# Patient Record
Sex: Male | Born: 1975 | Race: White | Hispanic: No | Marital: Single | Smoking: Current every day smoker
Health system: Southern US, Community
[De-identification: ages and names within clinical notes are randomized; demographics above are authoritative.]

## PROBLEM LIST (undated history)

## (undated) DIAGNOSIS — M1990 Unspecified osteoarthritis, unspecified site: Secondary | ICD-10-CM

## (undated) DIAGNOSIS — J449 Chronic obstructive pulmonary disease, unspecified: Secondary | ICD-10-CM

## (undated) HISTORY — PX: ELBOW SURGERY: SHX618

## (undated) HISTORY — PX: WRIST SURGERY: SHX841

## (undated) HISTORY — PX: HAND SURGERY: SHX662

---

## 2002-02-08 ENCOUNTER — Emergency Department (HOSPITAL_COMMUNITY): Admission: EM | Admit: 2002-02-08 | Discharge: 2002-02-08 | Payer: Self-pay | Admitting: Emergency Medicine

## 2005-02-28 ENCOUNTER — Emergency Department: Payer: Self-pay | Admitting: Emergency Medicine

## 2006-01-01 ENCOUNTER — Emergency Department: Payer: Self-pay | Admitting: Internal Medicine

## 2006-02-15 ENCOUNTER — Emergency Department: Payer: Self-pay | Admitting: Emergency Medicine

## 2007-11-13 ENCOUNTER — Emergency Department: Payer: Self-pay | Admitting: Emergency Medicine

## 2010-08-17 ENCOUNTER — Observation Stay: Payer: Self-pay | Admitting: Internal Medicine

## 2010-08-22 LAB — PATHOLOGY REPORT

## 2010-10-15 ENCOUNTER — Emergency Department: Payer: Self-pay | Admitting: Emergency Medicine

## 2011-07-05 ENCOUNTER — Emergency Department: Payer: Self-pay | Admitting: Unknown Physician Specialty

## 2011-07-11 ENCOUNTER — Ambulatory Visit: Payer: Self-pay | Admitting: Internal Medicine

## 2011-12-05 ENCOUNTER — Emergency Department: Payer: Self-pay | Admitting: Emergency Medicine

## 2011-12-14 LAB — ETHANOL
Ethanol %: 0.006 % (ref 0.000–0.080)
Ethanol: 6 mg/dL

## 2011-12-14 LAB — TSH: Thyroid Stimulating Horm: 3.72 u[IU]/mL

## 2011-12-14 LAB — COMPREHENSIVE METABOLIC PANEL
Albumin: 4.4 g/dL (ref 3.4–5.0)
Alkaline Phosphatase: 109 U/L (ref 50–136)
Bilirubin,Total: 0.6 mg/dL (ref 0.2–1.0)
Chloride: 106 mmol/L (ref 98–107)
Co2: 27 mmol/L (ref 21–32)
Creatinine: 0.87 mg/dL (ref 0.60–1.30)
Glucose: 99 mg/dL (ref 65–99)
Osmolality: 283 (ref 275–301)
Sodium: 141 mmol/L (ref 136–145)

## 2011-12-14 LAB — URINALYSIS, COMPLETE
Bacteria: NONE SEEN
Blood: NEGATIVE
Ph: 5 (ref 4.5–8.0)
RBC,UR: <1 /HPF (ref 0–5)
Squamous Epithelial: NONE SEEN

## 2011-12-14 LAB — CBC
MCH: 34.9 pg — ABNORMAL HIGH (ref 26.0–34.0)
MCHC: 34.7 g/dL (ref 32.0–36.0)
MCV: 100 fL (ref 80–100)
Platelet: 204 10*3/uL (ref 150–440)
RDW: 13.4 % (ref 11.5–14.5)

## 2011-12-15 ENCOUNTER — Inpatient Hospital Stay: Payer: Self-pay | Admitting: Psychiatry

## 2011-12-15 LAB — DRUG SCREEN, URINE
Amphetamines, Ur Screen: NEGATIVE (ref ?–1000)
Barbiturates, Ur Screen: NEGATIVE (ref ?–200)
Cannabinoid 50 Ng, Ur ~~LOC~~: NEGATIVE (ref ?–50)
Cocaine Metabolite,Ur ~~LOC~~: NEGATIVE (ref ?–300)
Methadone, Ur Screen: NEGATIVE (ref ?–300)
Opiate, Ur Screen: POSITIVE (ref ?–300)
Phencyclidine (PCP) Ur S: NEGATIVE (ref ?–25)

## 2011-12-16 LAB — FOLATE: Folic Acid: 24.8 ng/mL (ref 3.1–100.0)

## 2011-12-19 LAB — DRUG SCREEN, URINE
Cannabinoid 50 Ng, Ur ~~LOC~~: NEGATIVE (ref ?–50)
Cocaine Metabolite,Ur ~~LOC~~: NEGATIVE (ref ?–300)
MDMA (Ecstasy)Ur Screen: NEGATIVE (ref ?–500)
Phencyclidine (PCP) Ur S: NEGATIVE (ref ?–25)

## 2012-03-18 ENCOUNTER — Emergency Department: Payer: Self-pay | Admitting: Emergency Medicine

## 2012-03-18 LAB — COMPREHENSIVE METABOLIC PANEL
Albumin: 4.5 g/dL (ref 3.4–5.0)
Alkaline Phosphatase: 114 U/L (ref 50–136)
BUN: 8 mg/dL (ref 7–18)
Bilirubin,Total: 0.5 mg/dL (ref 0.2–1.0)
Calcium, Total: 8.5 mg/dL (ref 8.5–10.1)
Chloride: 111 mmol/L — ABNORMAL HIGH (ref 98–107)
Creatinine: 0.72 mg/dL (ref 0.60–1.30)
EGFR (African American): >60
EGFR (Non-African Amer.): >60
Glucose: 75 mg/dL (ref 65–99)
SGOT(AST): 29 U/L (ref 15–37)
SGPT (ALT): 22 U/L (ref 12–78)
Total Protein: 8.1 g/dL (ref 6.4–8.2)

## 2012-03-18 LAB — DRUG SCREEN, URINE
Benzodiazepine, Ur Scrn: NEGATIVE (ref ?–200)
Cannabinoid 50 Ng, Ur ~~LOC~~: NEGATIVE (ref ?–50)
MDMA (Ecstasy)Ur Screen: NEGATIVE (ref ?–500)
Opiate, Ur Screen: NEGATIVE (ref ?–300)

## 2012-03-18 LAB — CBC
HCT: 46.5 % (ref 40.0–52.0)
HGB: 16.1 g/dL (ref 13.0–18.0)
MCH: 34.2 pg — ABNORMAL HIGH (ref 26.0–34.0)
MCHC: 34.6 g/dL (ref 32.0–36.0)
MCV: 99 fL (ref 80–100)
RDW: 14 % (ref 11.5–14.5)
WBC: 8.4 10*3/uL (ref 3.8–10.6)

## 2012-03-18 LAB — SALICYLATE LEVEL: Salicylates, Serum: 1.9 mg/dL

## 2012-03-18 LAB — ETHANOL: Ethanol %: 0.278 % — ABNORMAL HIGH (ref 0.000–0.080)

## 2012-03-18 LAB — ACETAMINOPHEN LEVEL: Acetaminophen: <2 ug/mL

## 2012-07-24 ENCOUNTER — Emergency Department: Payer: Self-pay | Admitting: Emergency Medicine

## 2013-02-05 ENCOUNTER — Emergency Department: Payer: Self-pay | Admitting: Emergency Medicine

## 2013-02-15 ENCOUNTER — Emergency Department: Payer: Self-pay | Admitting: Emergency Medicine

## 2013-03-04 ENCOUNTER — Ambulatory Visit: Payer: Self-pay | Admitting: Nurse Practitioner

## 2013-05-30 IMAGING — CR DG SHOULDER 3+V*R*
1 series · 3 of 3 positions shown · non-contrast
Comparison: none

REASON FOR EXAM: pain x 3 days s./p fall     Flex 10
COMMENTS:   LMP: (Male)

PROCEDURE:     DXR - DXR SHOULDER RIGHT COMPLETE  - July 05, 2011  [DATE]
RESULT:     Comparison: None.

[Series 1: w shoulder external right · 0.14mm/px · 3 of 3 slices shown]
[im 1/3]
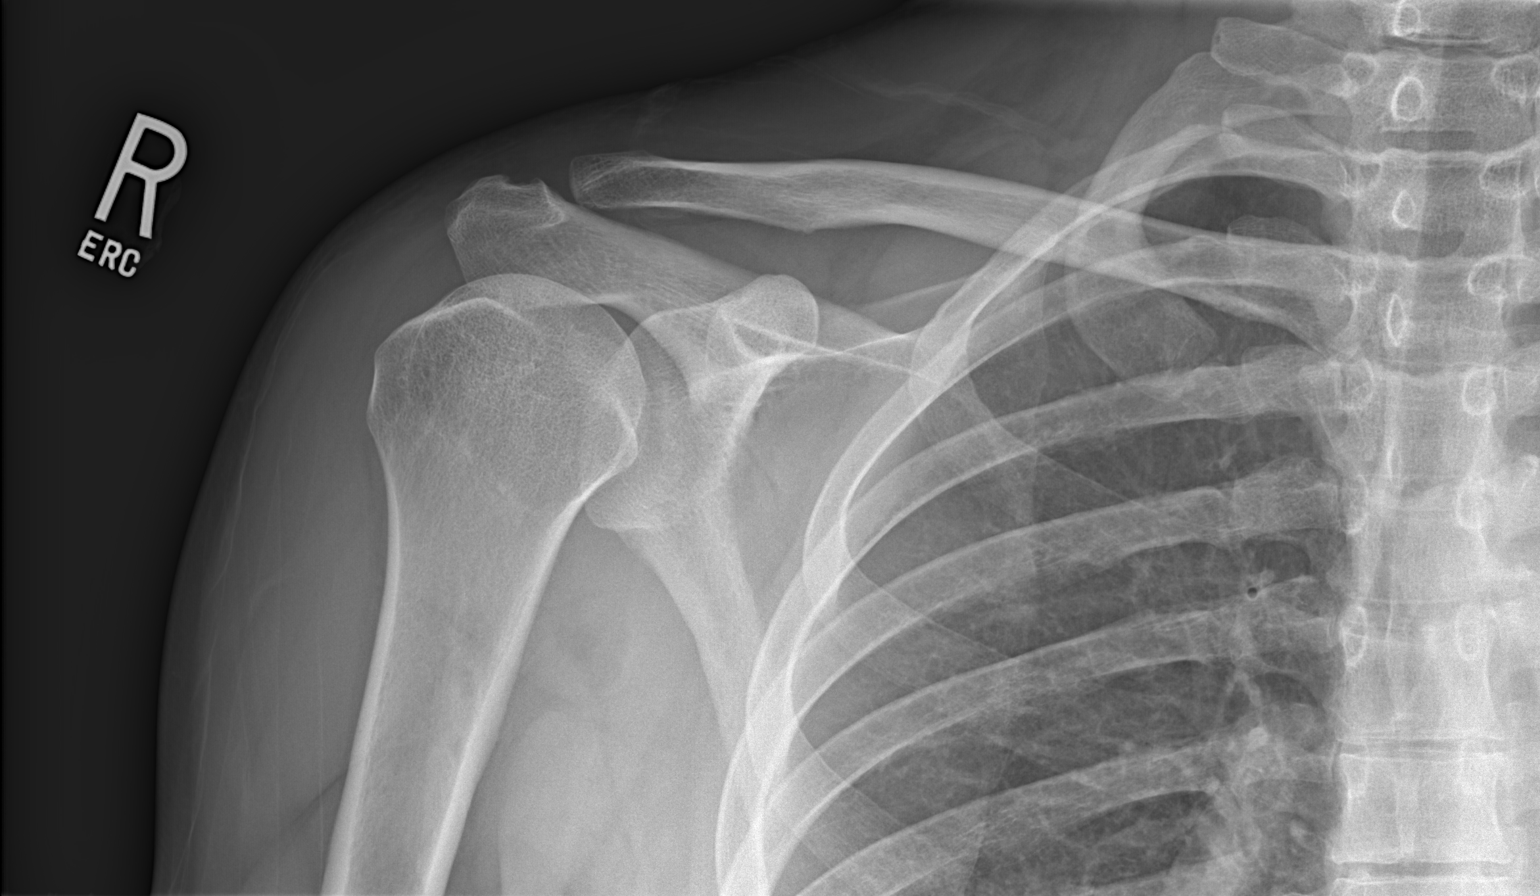
[im 2/3]
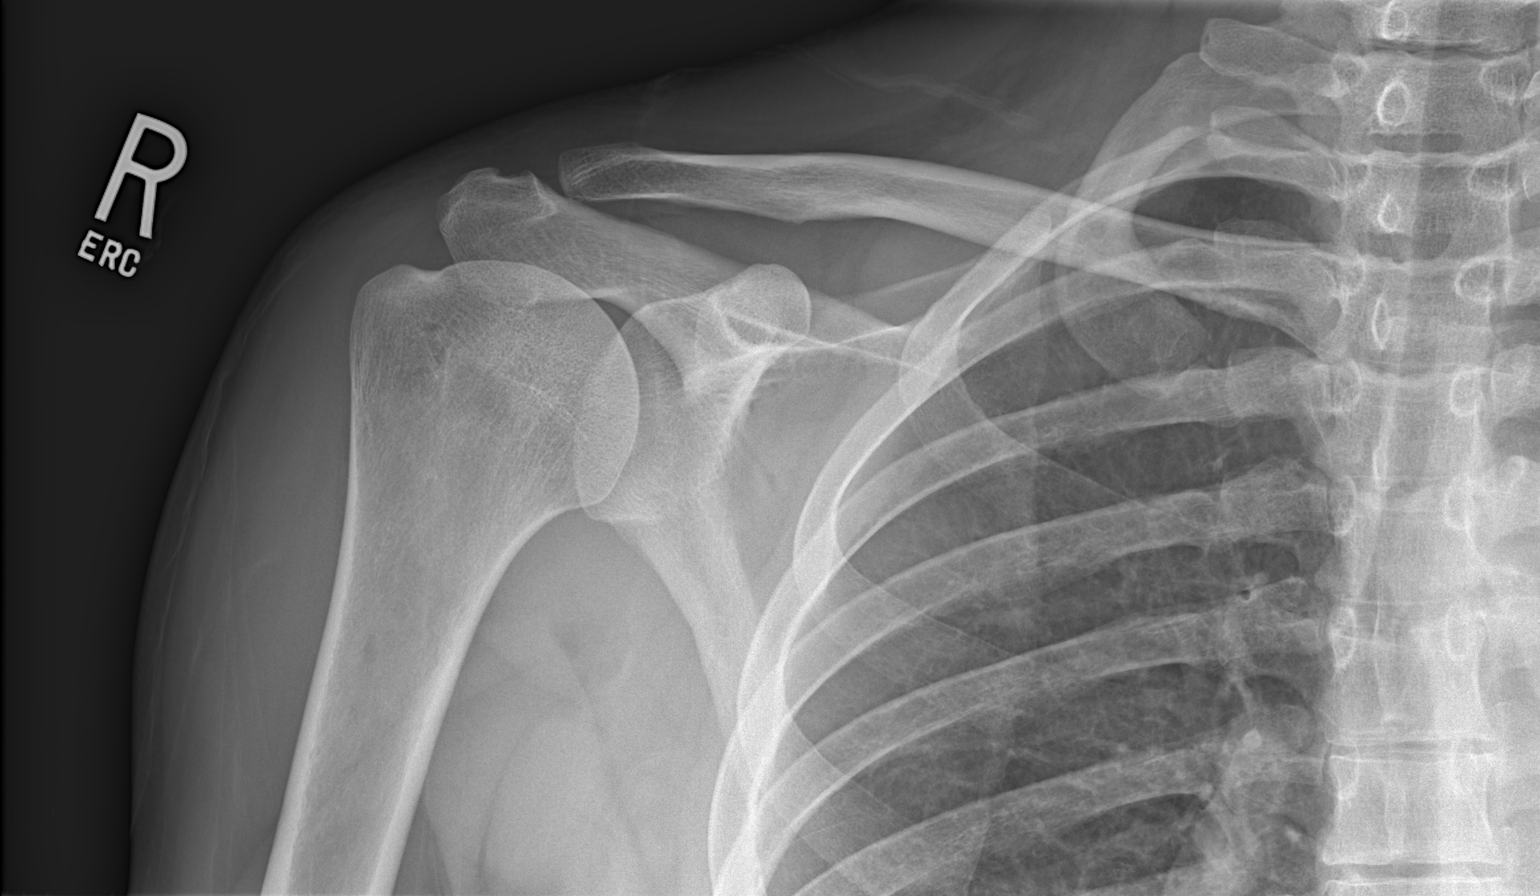
[im 3/3]
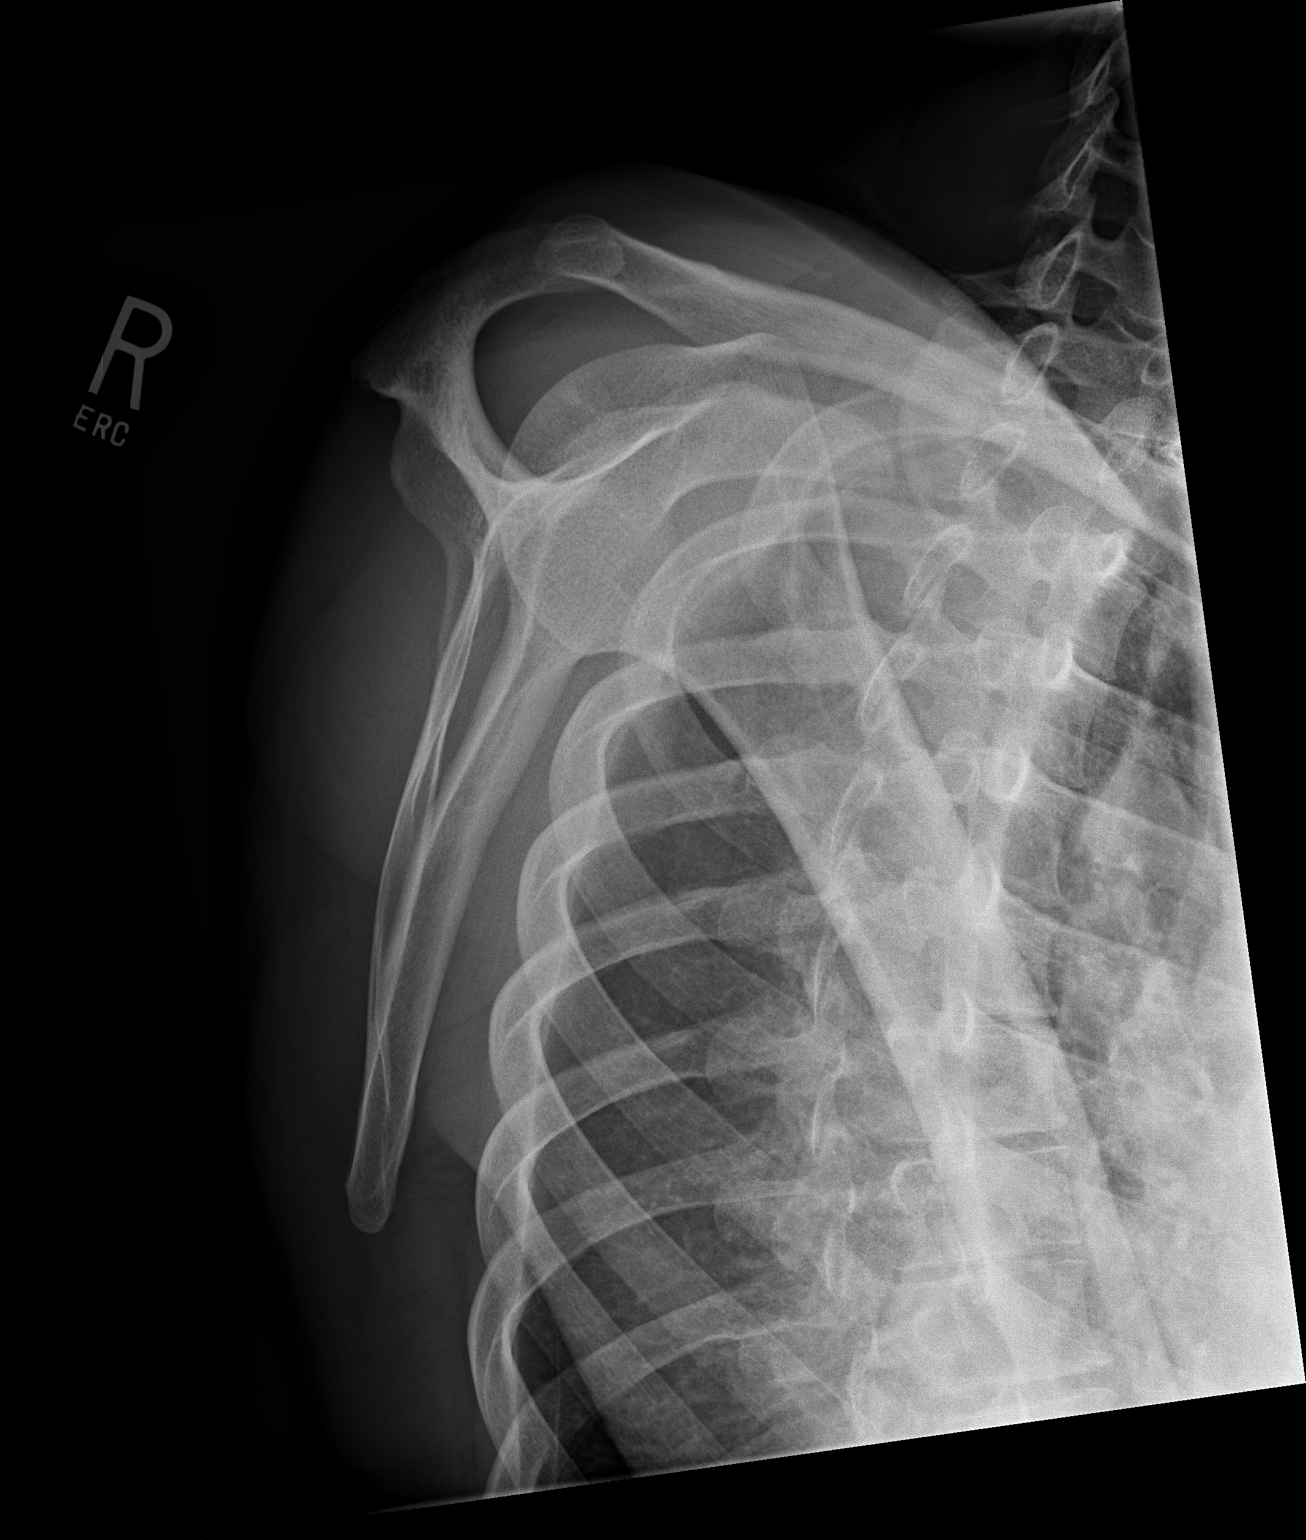

[3 of 3 positions shown; findings below may reference images not displayed]

FINDINGS: Evaluation of the orthogonal view is slightly limited by obliquity. No
definite fracture or dislocation. The acromioclavicular joint is
unremarkable.
IMPRESSION: No definite fracture or dislocation.

## 2013-10-30 IMAGING — CR DG RIBS 2V*R*
1 series · 4 of 4 positions shown · non-contrast
Comparison: none

REASON FOR EXAM: pain, trauma, increased pain with movement.
COMMENTS:   LMP: (Male)

PROCEDURE:     DXR - DXR RIBS RIGHT UNILATERAL  - December 05, 2011  [DATE]
RESULT:     There is no evidence of fracture, dislocation, or malalignment.

[Series 1: w ribs ap upper right · 0.14mm/px · 4 of 4 slices shown]
[im 1/4]
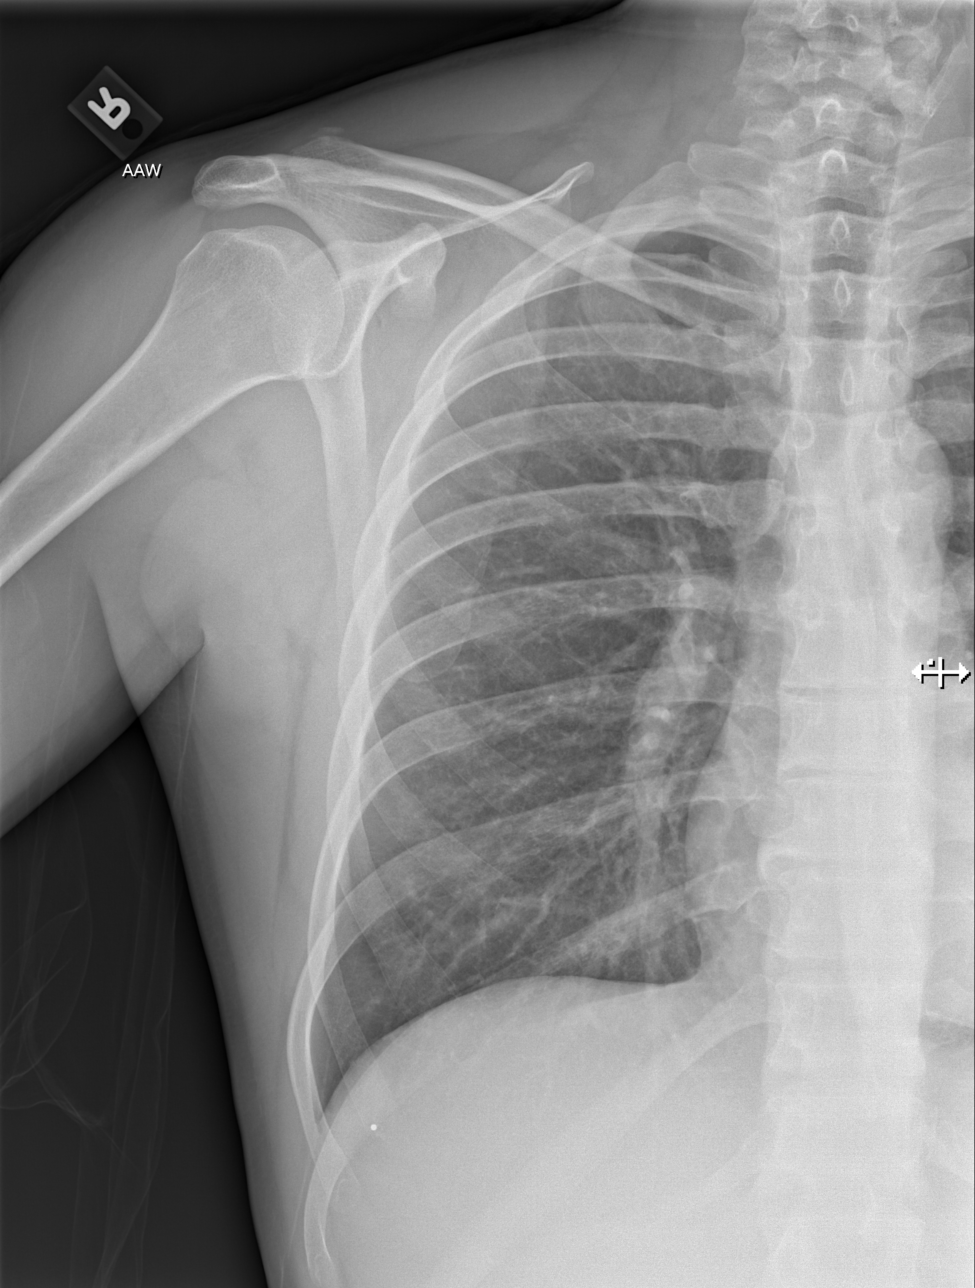
[im 2/4]
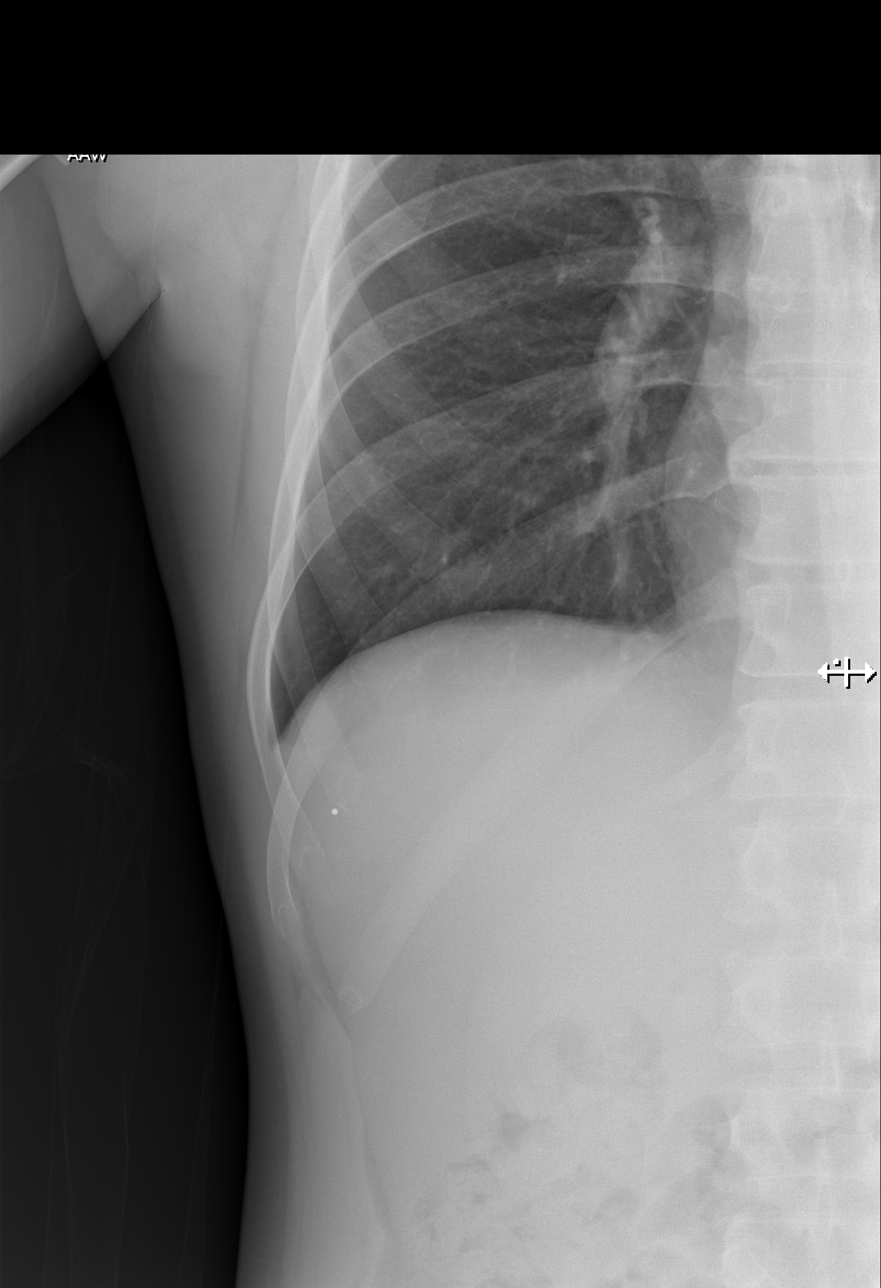
[im 3/4]
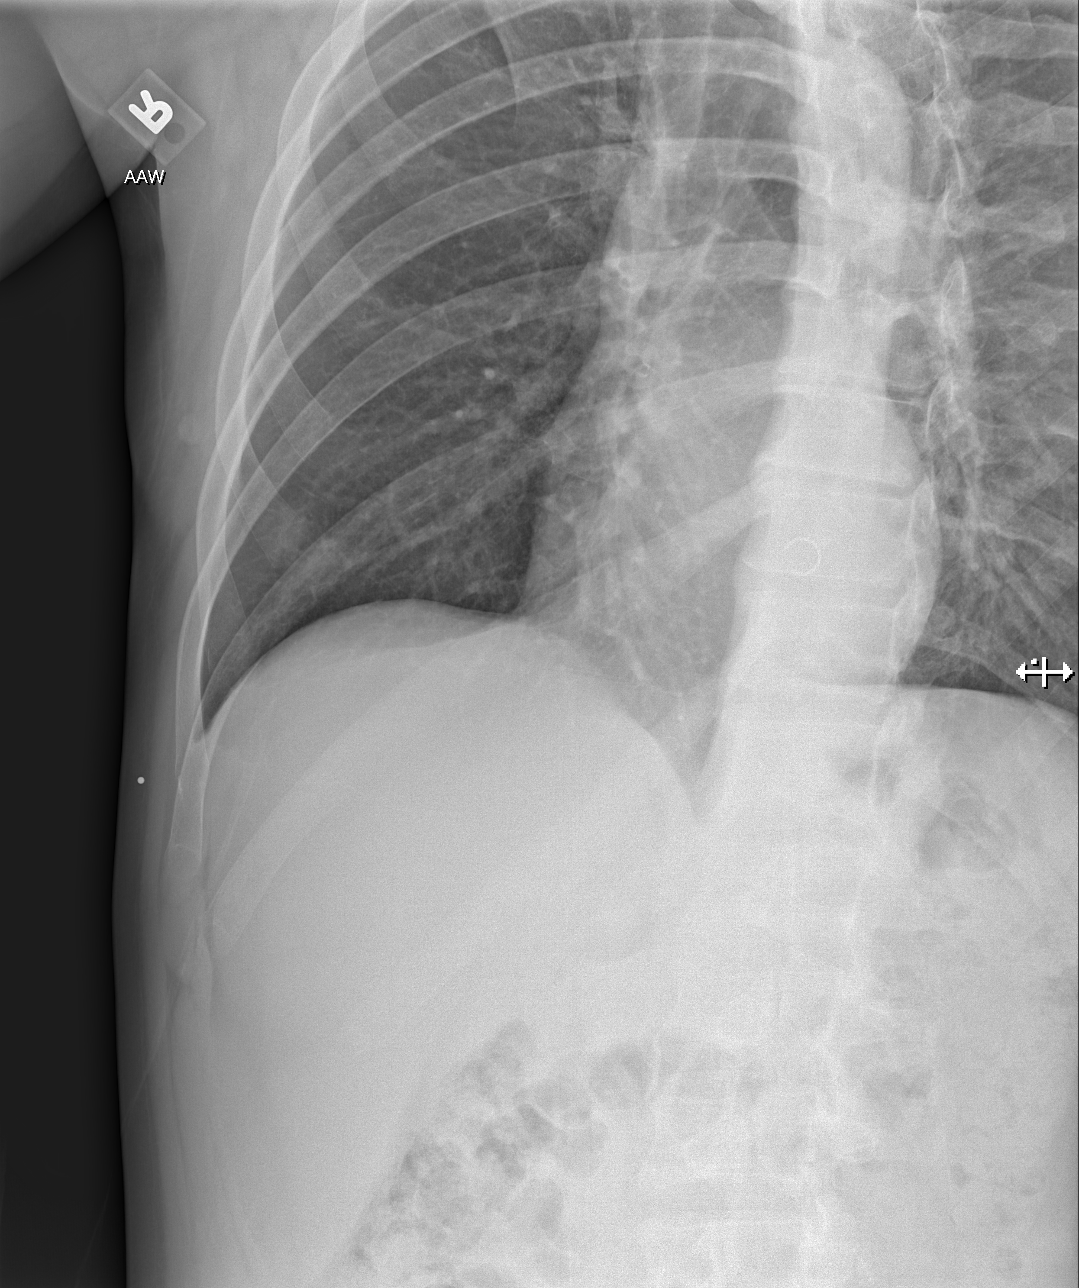
[im 4/4]
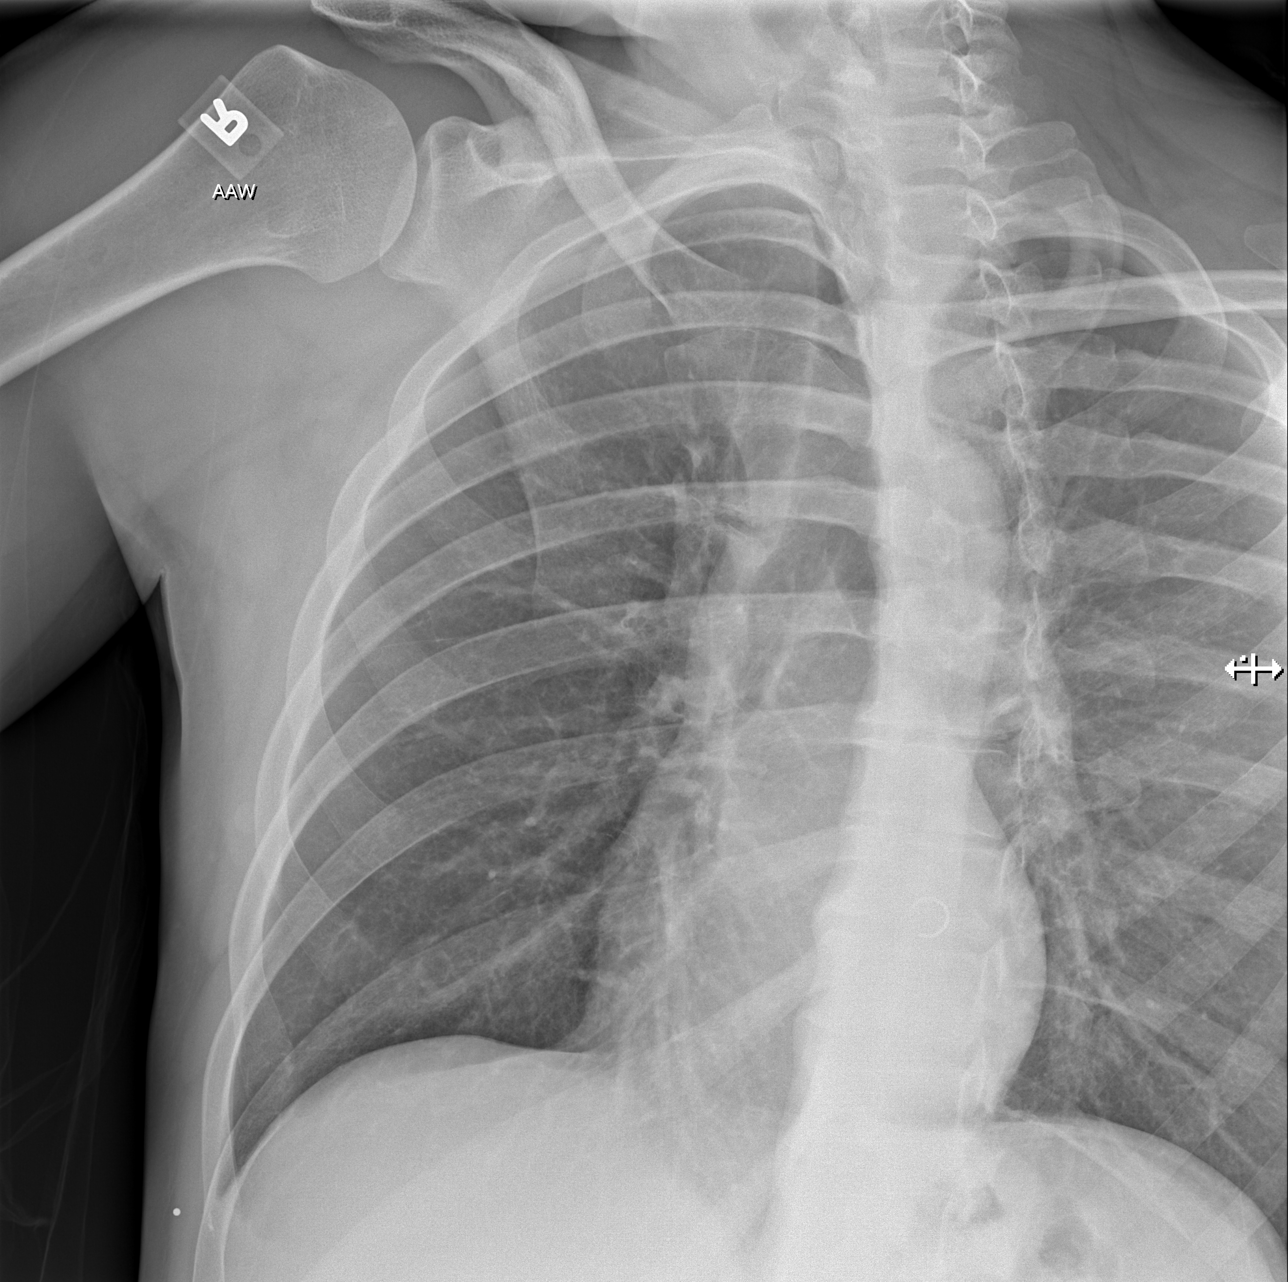

[4 of 4 positions shown; findings below may reference images not displayed]

IMPRESSION: 1. No evidence of acute abnormalities.
2. If there are persistent complaints of pain or persistent clinical
concern, a repeat evaluation in 7-10 days is recommended if clinically
warranted.

## 2014-08-12 NOTE — H&P (Signed)
PATIENT NAME:  Matthew Byrd, Matthew Byrd MR#:  [id number] DATE OF BIRTH:  1976/01/01  DATE OF ADMISSION:  12/15/2011  REFERRING PHYSICIAN: Bayard Males, MD  ADMITTING PHYSICIAN: Caryn Section, MD  REASON FOR ADMISSION: Suicidal thoughts and alcohol dependence.   IDENTIFYING INFORMATION: Matthew Byrd is a 39 year old Caucasian male with a 15 year history of alcohol dependence who was brought to the emergency room under IVC taken out by his sister. He states that he has three biological children and one stepdaughter. He currently works at [employer] for the past six months and lives with his girlfriend in the [place] area.  HISTORY OF PRESENT ILLNESS: Matthew Byrd is a 39 year old divorced Caucasian male with a history of alcohol dependence who was brought to the Emergency Room under IVC taken out by his sister due to the patient endorsing suicidal thoughts yesterday. The patient apparently asked his friend for a gun and had been endorsing thoughts that he wanted to be with his deceased mother. The patient apparently went and tried to lie in the street and said that he was going to be with his mother soon. According to his sister, his mother died one year ago. The patient himself was having a hard time recalling when his mother died. He has been drinking three to four 24 ounce beers every evening and says he binges up to an 18 pack on the weekends. He denies having blackouts but collateral information from his sister indicates episodes in which the patient is having problems with loss of memory that may be indicative of blackouts. He does deny any shakes or tremors. He has a history of three DUIs and is currently on probation for his most recent DUI. He denies any depressive symptoms including crying spells or difficulty with focus and concentration or feelings of hopelessness, but his sister reports that he has been crying on a daily basis and he has been carrying on conversations with his mother.  The patient himself is denying all symptoms stating that he does not have a problem and wanting to be discharged. He is denying any auditory or visual hallucinations. He denies any paranoid thoughts or delusions. The patient's sister reports that he has been having some aggressive episodes during these supposed blackouts in which he will call his stepfather and curse at him, but then cannot remember any of it. The patient denies any history of any prior suicide attempts or inpatient psychiatric hospitalizations. He has not ever been on any medications before for depression and states that he does not feel like he has a problem that needs medication. He denies any illicit drug use and says that he was given a prescription for oxycodone at [facility] yesterday. Toxicology screen was positive for opioids but negative for all other substances. Again, the history taken from the patient's sister and the patient differ. The patient's affect was quite blunted during the interview and speech was minimal. He was guarded and did not want to answer questions.   PAST PSYCHIATRIC HISTORY: The patient denies any prior inpatient psychiatric hospitalizations or suicide attempts. He did have court-ordered treatment at [facility] in the past. He denies ever being on any psychotropic medications in the past.   SUBSTANCE ABUSE HISTORY: As stated in the history of present illness, there is a 15 history of alcohol dependence with binge drinking on the weekends and drinking 3 to 4 beers on a daily basis through the week. He denies any shakes or tremors when he stops  drinking. He denies any history of any alcohol withdrawal seizures. He denies any history of any cocaine, cannabis, opiate, or prescription narcotic abuse. He does smoke 1 pack of cigarettes per day and has been smoking since his late teens.   FAMILY PSYCHIATRIC HISTORY: Both of the patient's parents were alcoholics and he has a paternal uncle that was an alcoholic.    PAST MEDICAL HISTORY: His primary care physician is Dr. Bluford Main at [facility]. He has asthma. He reports two broken ribs that occurred 10 days ago, although chest x-ray in the Emergency Room was negative for any fractures, on 12/02/2011, at [facility]. He has history of peptic ulcer disease and GI bleed, history of left elbow surgery, history of right wrist surgery, and history of finger surgery on the left hand. He denies any history of any prior TBI or seizures.   OUTPATIENT MEDICATIONS:  1. Oxycodone p.r.n.  2. Ibuprofen p.r.n.   ALLERGIES: Hydrocodone gives him a rash.   SOCIAL HISTORY: The patient was born and raised in [place] by both his biological parents. He says he was close to both parents growing up but they divorced when he was a child and his mother remarried. Both parents are deceased. The patient's mother died one year ago of chronic obstructive pulmonary disease and the patient's father died somewhere in the late 1990s of a myocardial infarction. He has a ninth-grade education and never obtained his GED. He currently lives in the [place] area with his girlfriend and is divorced. He has had two prior marriages. He has four children, an 28 year old who lives with his grandparents, a 73 year old who lives with their mother, a 81 year old daughter that is his girlfriend's child, and a 40-year-old with his girlfriend. The patient says his girlfriend does not drink heavily at all.   LEGAL HISTORY: He does have a history of two DUIs and is currently on probation for his last DUI.   MENTAL STATUS EXAM: Matthew Byrd is a 39 year old Caucasian male who is wearing burgundy scrub pants and a lime green shirt. He was fully alert and oriented to place and situation. He gave the month as August and the year as 2013, but had a difficult time coming up with the day of the month. He knew that it was Thursday. Speech was slow and soft but fluent and coherent. The patient was guarded and  affect was blunted. He did not want to answer a lot of questions and was very upset that he had been committed. Mood was described as being "not good". Thought processes were linear, logical, and goal directed. He denied any current suicidal thoughts or homicidal thoughts. She denied any current auditory or visual hallucinations. He denied any paranoid thoughts or delusions. Attention and concentration were fair, but the patient answered "I don't know" to a lot of questions asked with regards to memory and recall. He named [employer] as the current president, but he had difficulty naming any prior presidents. He was able to spell world backwards correctly as "DLROW". He had a difficult time with serial sevens after 93. When asked about proverbs, the patient said "I don't know".   SUICIDE RISK ASSESSMENT: At this time, Matthew Byrd denies any current suicidal thoughts or homicidal thoughts, but according to his family was asking a friend for a gun and has been talking about being close to his deceased mother. His risk of harm to self and others at this time is moderate.  REVIEW OF SYSTEMS: CONSTITUTIONAL: He denies  any weakness, fatigue or weight changes. He denies any fever, chills, or night sweats. HEAD: He denies headaches or dizziness. EYES: He denies any diplopia or blurred vision. ENT: He denies any neck pain or throat pain. He denies any difficulty swallowing. RESPIRATORY: He denies any shortness of breath or cough. CARDIOVASCULAR: He denies chest pain or orthopnea. GASTROINTESTINAL: He denies any nausea, vomiting, or abdominal pain. He denies any change in bowel movements. GENITOURINARY: He denies incontinence or problems with frequency of urine. ENDOCRINE: He denies any heat or cold intolerance. LYMPHATIC: He denies anemia or easy bruising. MUSCULOSKELETAL: He does complain of right-sided rib pain. He denies any other muscle or joint pain. NEUROLOGIC: He denies any tingling or weakness. PSYCHIATRIC: Please see  history of present illness.     PHYSICAL EXAMINATION:   VITAL SIGNS: Blood pressure is 106/74, heart rate 64, respirations 18, and temperature 96.9.   HEENT: Normocephalic, atraumatic. Pupils are equal, round and reactive to light and accommodation. Extraocular movements are intact. Oral mucosa is moist. No lesions are noted.   NECK: Neck was supple. No cervical lymphadenopathy or thyromegaly present.   LUNGS: Clear to auscultation bilaterally. No crackles, rales, or rhonchi.   CARDIAC: S1 and S2 present, regular rate and rhythm. No murmurs, rubs, or gallops.   ABDOMEN: Soft and normoactive bowel sounds present in all four quadrants. No tenderness noted. No masses noted.   EXTREMITIES: The patient had multiple tattoos on his upper extremities and his chest as well as his neck. No rashes, clubbing, or edema.   NEUROLOGIC: Cranial nerves II through XII grossly intact. Gait was normal and steady. Negative Romberg. No tremors noted.   LABS/STUDIES: Toxicology screen positive for opioids but negative for all other substances. Ethanol level 6. BMP within normal limits. TSH within normal limits. Alkaline phosphatase 109, AST 45, and ALT 43. White blood cell count, hemoglobin, and platelet count all within normal limits. Urinalysis was nitrite and leukocyte esterase negative with 1 WBC and no bacteria.   DIAGNOSES:   AXIS I:  1. Alcohol dependence.  2. Rule out major depression with psychosis.   AXIS II: Deferred.   AXIS III: Asthma and recent injury to the right side of his chest and possible rib fracture.   AXIS IV: Moderate - comorbid substance use, not wanting to be compliant with treatment, struggling with the death of his mother, and history of legal problems.  AXIS V: GAF at present equals 30.   ASSESSMENT AND TREATMENT RECOMMENDATIONS: Matthew Byrd is a 39 year old divorced Caucasian male with a history of alcohol dependence over the past 15 years who was brought to the Emergency  Room after endorsing suicidal thoughts and asking a friend for a gun. He is denying any psychotic symptoms although his sister reports that he has been talking to his deceased mother. We will admit to inpatient psychiatry for medication management, safety, stabilization, and detox.  1. Rule out major depressive disorder: At this time, the patient is denying any symptoms of depression and is angry that he was committed. His sister gives a different story and says that he has been severely depressed since his mother died. We will look at possibly starting Zoloft 50 mg p.o. daily for depressive symptoms in the hope that the patient will be more open about depressive symptoms. We will check B12 and folic acid in the a.m. We will offer trazodone 100 mg p.o. nightly for insomnia.  2. Alcohol dependence: We will place the patient on Ativan per CIWA as  well as give multivitamin, thiamine and folic acid. At this time, he is opposed to any residential substance abuse treatment and does not feel like he has a problem with alcohol. We will help to increase insight with regards to negative consequences of alcohol use and encourage the patient to enter a meaningful recovery program.  3. Asthma: We will offer albuterol inhaler p.r.n.  4. Right-sided rib pain: We will get repeat chest x-ray to see if the patient actually does have fractures on the right side as a followup to the 12/02/2011 chest x-ray.  5. Disposition: The patient has a stable living situation. We will recommend substance abuse treatment at the time of discharge as well as psychotropic medication management.  ____________________________ Doralee AlbinoAarti K. Maryruth BunKapur, MD akk:slb D: 12/15/2011 10:02:59 ET     T: 12/15/2011 10:31:01 ET        JOB#: [id number] cc: Jesiah Yerby K. Maryruth BunKapur, MD, <Dictator> Darliss RidgelAARTI K Kellianne Ek MD ELECTRONICALLY SIGNED 12/20/2011 10:02

## 2014-11-19 ENCOUNTER — Emergency Department: Payer: Medicaid Other

## 2014-11-19 ENCOUNTER — Encounter: Payer: Self-pay | Admitting: Emergency Medicine

## 2014-11-19 ENCOUNTER — Emergency Department
Admission: EM | Admit: 2014-11-19 | Discharge: 2014-11-19 | Disposition: A | Discharge status: Home / Self Care | Payer: Medicaid Other | Attending: Emergency Medicine | Admitting: Emergency Medicine

## 2014-11-19 DIAGNOSIS — W208XXA Other cause of strike by thrown, projected or falling object, initial encounter: Secondary | ICD-10-CM | POA: Insufficient documentation

## 2014-11-19 DIAGNOSIS — Y9289 Other specified places as the place of occurrence of the external cause: Secondary | ICD-10-CM | POA: Insufficient documentation

## 2014-11-19 DIAGNOSIS — Z720 Tobacco use: Secondary | ICD-10-CM | POA: Insufficient documentation

## 2014-11-19 DIAGNOSIS — Y9389 Activity, other specified: Secondary | ICD-10-CM | POA: Diagnosis not present

## 2014-11-19 DIAGNOSIS — S62634B Displaced fracture of distal phalanx of right ring finger, initial encounter for open fracture: Secondary | ICD-10-CM | POA: Insufficient documentation

## 2014-11-19 DIAGNOSIS — Y998 Other external cause status: Secondary | ICD-10-CM | POA: Diagnosis not present

## 2014-11-19 DIAGNOSIS — IMO0001 Reserved for inherently not codable concepts without codable children: Secondary | ICD-10-CM

## 2014-11-19 DIAGNOSIS — S6991XA Unspecified injury of right wrist, hand and finger(s), initial encounter: Secondary | ICD-10-CM | POA: Diagnosis present

## 2014-11-19 MED ORDER — OXYCODONE-ACETAMINOPHEN 7.5-325 MG PO TABS: 1.0000 | ORAL_TABLET | ORAL | Status: DC | PRN

## 2014-11-19 MED ORDER — LIDOCAINE HCL (PF) 1 % IJ SOLN
5.0000 mL | Freq: Once | INTRAMUSCULAR | Status: AC
  Administered 2014-11-19: 5 mL via INTRADERMAL
  Filled 2014-11-19: qty 5

## 2014-11-19 MED ORDER — OXYCODONE-ACETAMINOPHEN 5-325 MG PO TABS
1.0000 | ORAL_TABLET | Freq: Once | ORAL | Status: AC
  Administered 2014-11-19: 1 via ORAL
  Filled 2014-11-19: qty 1

## 2014-11-19 MED ORDER — SILVER NITRATE-POT NITRATE 75-25 % EX MISC
3.0000 | Freq: Once | CUTANEOUS | Status: AC
  Administered 2014-11-19: 3 via TOPICAL

## 2014-11-19 MED ORDER — SULFAMETHOXAZOLE-TRIMETHOPRIM 800-160 MG PO TABS: 1.0000 | ORAL_TABLET | Freq: Two times a day (BID) | ORAL | Status: DC

## 2014-11-19 MED ORDER — TRIPLE ANTIBIOTIC 3.5-400-5000 EX OINT
TOPICAL_OINTMENT | Freq: Once | CUTANEOUS | Status: AC
Start: 2014-11-19 — End: 2014-11-19
  Administered 2014-11-19: 11:00:00 via TOPICAL

## 2014-11-19 MED ORDER — SILVER NITRATE-POT NITRATE 75-25 % EX MISC
CUTANEOUS | Status: AC
  Administered 2014-11-19: 3 via TOPICAL
  Filled 2014-11-19: qty 2

## 2014-11-19 NOTE — ED Notes (Signed)
Ladder fell on finger tip , right hand 4th digit . drsg applied in triage , oozing blood noted from injury

## 2014-11-19 NOTE — ED Provider Notes (Signed)
[facility] Emergency Department Provider Note  ____________________________________________  Time seen: Approximately 10:07 AM  I have reviewed the triage vital signs and the nursing notes.   HISTORY  Chief Complaint Extremity Laceration    HPI ZUBAIR LOFTON III is a 39 y.o. male patient Complaining of nail avulsion and laceration to the distal fourth digit right hand. Patient states is secondary to a ladder falling on the hand causing a crush injury. Mild hemorrhage and is still apparent. Patient denies any loss sensation loss of function. Patient stated he believed the bone is exposed. No palliative measures.He is rating his pain as sharp and a 10 over 10.   History reviewed. No pertinent past medical history.  There are no active problems to display for this patient.   History reviewed. No pertinent past surgical history.  Current Outpatient Rx  Name  Route  Sig  Dispense  Refill  . oxyCODONE-acetaminophen (PERCOCET) 7.5-325 MG per tablet   Oral   Take 1 tablet by mouth every 4 (four) hours as needed for severe pain.   20 tablet   0   . sulfamethoxazole-trimethoprim (BACTRIM DS,SEPTRA DS) 800-160 MG per tablet   Oral   Take 1 tablet by mouth 2 (two) times daily.   20 tablet   0     Allergies Hydrocil and Hydrocodone  No family history on file.  Social History History  Substance Use Topics  . Smoking status: Current Every Day Smoker  . Smokeless tobacco: Not on file  . Alcohol Use: Not on file    Review of Systems Constitutional: No fever/chills Eyes: No visual changes. ENT: No sore throat. Cardiovascular: Denies chest pain. Respiratory: Denies shortness of breath. Gastrointestinal: No abdominal pain.  No nausea, no vomiting.  No diarrhea.  No constipation. Genitourinary: Negative for dysuria. Musculoskeletal: Pain and edema fourth digit right dominant hand.. Skin: Negative for rash. Complete nail avulsion and laceration to  the fourth digit right hand Neurological: Negative for headaches, focal weakness or numbness. Allergic/Immunilogical: Vicodin 10-point ROS otherwise negative.  ____________________________________________   PHYSICAL EXAM:  VITAL SIGNS: ED Triage Vitals  Enc Vitals Group     BP 11/19/14 0850 125/90 mmHg     Pulse Rate 11/19/14 0850 78     Resp 11/19/14 0850 18     Temp 11/19/14 0850 98.3 F (36.8 C)     Temp Source 11/19/14 0850 Oral     SpO2 11/19/14 0850 100 %     Weight 11/19/14 0850 160 lb (72.576 kg)     Height 11/19/14 0850 5\' 9"  (1.753 m)     Head Cir --      Peak Flow --      Pain Score 11/19/14 0851 10     Pain Loc --      Pain Edu? --      Excl. in GC? --     Constitutional: Alert and oriented. Her distress Eyes: Conjunctivae are normal. PERRL. EOMI. Head: Atraumatic. Nose: No congestion/rhinnorhea. Mouth/Throat: Mucous membranes are moist.  Oropharynx non-erythematous. Neck: No stridor.  No cervical spine tenderness to palpation. Hematological/Lymphatic/Immunilogical: No cervical lymphadenopathy. Cardiovascular: Normal rate, regular rhythm. Grossly normal heart sounds.  Good peripheral circulation. Respiratory: Normal respiratory effort.  No retractions. Lungs CTAB. Gastrointestinal: Soft and nontender. No distention. No abdominal bruits. No CVA tenderness. Musculoskeletal: Most complete nail avulsion of the fourth digit right hand moderate edema with an open fracture of the distal phalanges. Neurovascular intact free and equal range of motion. Neurologic:  Normal speech and language. No gross focal neurologic deficits are appreciated. No gait instability. Skin:  Skin is warm, dry and intact. No rash noted. Laceration and abrasion to the fourth digit right hand. Psychiatric: Mood and affect are normal. Speech and behavior are normal.  ____________________________________________   LABS (all labs ordered are listed, but only abnormal results are  displayed)  Labs Reviewed - No data to display ____________________________________________  EKG   ____________________________________________  RADIOLOGY  Comminuted fracture involving the tuft of the distal phalanges of the right fourth digit. I, Joni Reining, personally viewed and evaluated these images as part of my medical decision making.   ____________________________________________   PROCEDURES  Procedure(s) performed: None  Critical Care performed: No  ____________________________________________   INITIAL IMPRESSION / ASSESSMENT AND PLAN / ED COURSE  Pertinent labs & imaging results that were available during my care of the patient were reviewed by me and considered in my medical decision making (see chart for details).  Open tuft fracture of the fourth digit right hand. Nail avulsion of the fourth digit right hand. Findings consistent with open fracture. Discussed patient with Dr. Ernest Pine on call orthopedics who have advised to loosely approximate the skin a pressure dressing and have him follow-up in 48 hours at his clinic. Patient will be discharged Percocets, Bactrim and advise change the outer dressing daily. Patient advised return by ER physician condition worsens. ____________________________________________   FINAL CLINICAL IMPRESSION(S) / ED DIAGNOSES  Final diagnoses:  Fracture of fourth finger, distal phalanx, right, open, initial encounter      Joni Reining, PA-C 11/19/14 1116  Governor Rooks, MD 11/19/14 [phone]

## 2015-06-24 ENCOUNTER — Encounter: Payer: Self-pay | Admitting: Emergency Medicine

## 2015-06-24 ENCOUNTER — Emergency Department
Admission: EM | Admit: 2015-06-24 | Discharge: 2015-06-24 | Disposition: A | Discharge status: Home / Self Care | Payer: Medicaid Other | Attending: Emergency Medicine | Admitting: Emergency Medicine

## 2015-06-24 ENCOUNTER — Emergency Department: Payer: Medicaid Other

## 2015-06-24 DIAGNOSIS — W19XXXA Unspecified fall, initial encounter: Secondary | ICD-10-CM

## 2015-06-24 DIAGNOSIS — Y998 Other external cause status: Secondary | ICD-10-CM | POA: Insufficient documentation

## 2015-06-24 DIAGNOSIS — R55 Syncope and collapse: Secondary | ICD-10-CM | POA: Diagnosis not present

## 2015-06-24 DIAGNOSIS — S199XXA Unspecified injury of neck, initial encounter: Secondary | ICD-10-CM | POA: Diagnosis not present

## 2015-06-24 DIAGNOSIS — W108XXA Fall (on) (from) other stairs and steps, initial encounter: Secondary | ICD-10-CM | POA: Insufficient documentation

## 2015-06-24 DIAGNOSIS — Y9389 Activity, other specified: Secondary | ICD-10-CM | POA: Diagnosis not present

## 2015-06-24 DIAGNOSIS — S0181XA Laceration without foreign body of other part of head, initial encounter: Secondary | ICD-10-CM | POA: Diagnosis not present

## 2015-06-24 DIAGNOSIS — S01112A Laceration without foreign body of left eyelid and periocular area, initial encounter: Secondary | ICD-10-CM | POA: Insufficient documentation

## 2015-06-24 DIAGNOSIS — Y9289 Other specified places as the place of occurrence of the external cause: Secondary | ICD-10-CM | POA: Insufficient documentation

## 2015-06-24 DIAGNOSIS — S0083XA Contusion of other part of head, initial encounter: Secondary | ICD-10-CM

## 2015-06-24 DIAGNOSIS — F17200 Nicotine dependence, unspecified, uncomplicated: Secondary | ICD-10-CM | POA: Diagnosis not present

## 2015-06-24 DIAGNOSIS — S0990XA Unspecified injury of head, initial encounter: Secondary | ICD-10-CM | POA: Diagnosis present

## 2015-06-24 MED ORDER — LIDOCAINE-EPINEPHRINE (PF) 2 %-1:200000 IJ SOLN
10.0000 mL | Freq: Once | INTRAMUSCULAR | Status: DC
  Filled 2015-06-24: qty 10

## 2015-06-24 MED ORDER — OXYCODONE-ACETAMINOPHEN 5-325 MG PO TABS
1.0000 | ORAL_TABLET | Freq: Once | ORAL | Status: AC
Start: 2015-06-24 — End: 2015-06-24
  Administered 2015-06-24: 1 via ORAL
  Filled 2015-06-24: qty 1

## 2015-06-24 MED ORDER — LIDOCAINE-EPINEPHRINE (PF) 1 %-1:200000 IJ SOLN
INTRAMUSCULAR | Status: AC
  Administered 2015-06-24: 30 mL
  Filled 2015-06-24: qty 30

## 2015-06-24 MED ORDER — OXYCODONE-ACETAMINOPHEN 5-325 MG PO TABS: 1.0000 | ORAL_TABLET | Freq: Four times a day (QID) | ORAL | Status: DC | PRN

## 2015-06-24 MED ORDER — LIDOCAINE-EPINEPHRINE (PF) 1 %-1:200000 IJ SOLN
30.0000 mL | Freq: Once | INTRAMUSCULAR | Status: AC
  Administered 2015-06-24: 30 mL

## 2015-06-24 NOTE — ED Provider Notes (Addendum)
[facility] Emergency Department Provider Note  ____________________________________________  Time seen: Approximately 3:39 PM  I have reviewed the triage vital signs and the nursing notes.   HISTORY  Chief Complaint Laceration    HPI Matthew Byrd is a 40 y.o. male who presents emergency department for complaint of syncope, fall down his deck stairs striking his face and cutting his forehead. Patient states that he is not having any headaches or neck pain at this time. He does report significant facial pain. Patient denies any visual acuity changes. His last tetanus shot was one year ago.   History reviewed. No pertinent past medical history.  There are no active problems to display for this patient.   History reviewed. No pertinent past surgical history.  Current Outpatient Rx  Name  Route  Sig  Dispense  Refill  . oxyCODONE-acetaminophen (ROXICET) 5-325 MG tablet   Oral   Take 1 tablet by mouth every 6 (six) hours as needed for severe pain.   20 tablet   0   . sulfamethoxazole-trimethoprim (BACTRIM DS,SEPTRA DS) 800-160 MG per tablet   Oral   Take 1 tablet by mouth 2 (two) times daily.   20 tablet   0     Allergies Hydrocil and Hydrocodone  History reviewed. No pertinent family history.  Social History Social History  Substance Use Topics  . Smoking status: Current Every Day Smoker  . Smokeless tobacco: None  . Alcohol Use: None     Review of Systems  Constitutional: No fever/chills Eyes: No visual changes.  Cardiovascular: no chest pain. Respiratory: no cough. No SOB. Gastrointestinal: No abdominal pain.  No nausea, no vomiting.   Musculoskeletal: Negative for back pain. As of her neck pain. Positive for facial pain. Skin: Negative for rash. Positive for laceration over the left eye. Neurological: Negative for headaches, focal weakness or numbness. 10-point ROS otherwise  negative.  ____________________________________________   PHYSICAL EXAM:  VITAL SIGNS: ED Triage Vitals  Enc Vitals Group     BP 06/24/15 1357 101/64 mmHg     Pulse Rate 06/24/15 1357 79     Resp 06/24/15 1357 18     Temp 06/24/15 1357 98.3 F (36.8 C)     Temp Source 06/24/15 1357 Oral     SpO2 06/24/15 1357 96 %     Weight 06/24/15 1357 170 lb (77.111 kg)     Height 06/24/15 1357  (1.753 m)     Head Cir --      Peak Flow --      Pain Score 06/24/15 1359 10     Pain Loc --      Pain Edu? --      Excl. in GC? --      Constitutional: Alert and oriented. Well appearing and in no acute distress. Eyes: Conjunctivae are normal. PERRL. EOMI. Head: Multiple facial contusions visualized. Patient has raccoon eyes. Laceration over the left eyebrow. Patient is diffusely tender to palpation over the facial bones. No palpable abnormality. No crepitus to palpation of the face or the scalp. ENT:      Ears: Nose erythematous fluid drainage.      Nose: No congestion/rhinnorhea. No serosanguineous fluid drainage.      Mouth/Throat: Mucous membranes are moist.  Neck: No stridor.  No cervical spine tenderness to palpation. Cardiovascular: Normal rate, regular rhythm. Normal S1 and S2.  Good peripheral circulation. Respiratory: Normal respiratory effort without tachypnea or retractions. Lungs CTAB. Musculoskeletal: No lower extremity tenderness  nor edema.  No joint effusions. Neurologic:  Normal speech and language. No gross focal neurologic deficits are appreciated. Radial nerves II through XII are grossly intact. Skin:  Skin is warm, dry and intact. No rash noted. Laceration noted to the left eyebrow. Laceration is approximately 2.5 cm in length. No visible Foreign body. Bleeding is controlled. Psychiatric: Mood and affect are normal. Speech and behavior are normal. Patient exhibits appropriate insight and judgement.   ____________________________________________   LABS (all labs  ordered are listed, but only abnormal results are displayed)  Labs Reviewed - No data to display ____________________________________________  EKG   ____________________________________________  RADIOLOGY Festus Barren Merlyn Bollen, personally viewed and evaluated these images as part of my medical decision making, as well as reviewing the written report by the radiologist.  Ct Head Wo Contrast  06/24/2015  CLINICAL DATA:  Status post fall downstairs with a blow to the head last night. Laceration left eyebrow. Initial encounter. EXAM: CT HEAD WITHOUT CONTRAST CT MAXILLOFACIAL WITHOUT CONTRAST CT CERVICAL SPINE WITHOUT CONTRAST TECHNIQUE: Multidetector CT imaging of the head, cervical spine, and maxillofacial structures were performed using the standard protocol without intravenous contrast. Multiplanar CT image reconstructions of the cervical spine and maxillofacial structures were also generated. COMPARISON:  None. FINDINGS: CT HEAD FINDINGS There is no evidence of acute intracranial abnormality including hemorrhage, infarct, mass lesion, mass effect, midline shift or abnormal extra-axial fluid collection. No hydrocephalus or pneumocephalus. The calvarium is intact. CT MAXILLOFACIAL FINDINGS The mandibular condyles are located. No facial bone fracture is seen. Punctate radiopaque foreign body is seen in the shallow subcutaneous tissues lateral to the right eye. Hematoma and laceration about the left eye are seen and there is hematoma over the right maxilla. The globes are intact and the lenses are located. Orbital fat is clear. Optic nerves and extraocular muscles appear normal. The patient is status post bilateral maxillary antrostomy. Mild mucosal thickening is seen in the right maxillary sinus. There is extensive mucosal thickening in the left maxillary. Mild ethmoid air cell disease is noted. CT CERVICAL SPINE FINDINGS No cervical spine fracture or malalignment is identified. Intervertebral disc space  height is maintained. The facet joints are unremarkable. IMPRESSION: Contusions about the face with a laceration above the left eye. Negative for facial bone fracture. Negative head and cervical spine CT scans. Punctate radiopaque foreign body in the superficial subcutaneous tissues just lateral to the right eye of unknown chronicity. Left much worse than right maxillary sinus mucosal thickening. The patient is status post bilateral maxillary antrostomy. Electronically Signed   By: Drusilla Kanner M.D.   On: 06/24/2015 14:51   Ct Cervical Spine Wo Contrast  06/24/2015  CLINICAL DATA:  Status post fall downstairs with a blow to the head last night. Laceration left eyebrow. Initial encounter. EXAM: CT HEAD WITHOUT CONTRAST CT MAXILLOFACIAL WITHOUT CONTRAST CT CERVICAL SPINE WITHOUT CONTRAST TECHNIQUE: Multidetector CT imaging of the head, cervical spine, and maxillofacial structures were performed using the standard protocol without intravenous contrast. Multiplanar CT image reconstructions of the cervical spine and maxillofacial structures were also generated. COMPARISON:  None. FINDINGS: CT HEAD FINDINGS There is no evidence of acute intracranial abnormality including hemorrhage, infarct, mass lesion, mass effect, midline shift or abnormal extra-axial fluid collection. No hydrocephalus or pneumocephalus. The calvarium is intact. CT MAXILLOFACIAL FINDINGS The mandibular condyles are located. No facial bone fracture is seen. Punctate radiopaque foreign body is seen in the shallow subcutaneous tissues lateral to the right eye. Hematoma and laceration about the left  eye are seen and there is hematoma over the right maxilla. The globes are intact and the lenses are located. Orbital fat is clear. Optic nerves and extraocular muscles appear normal. The patient is status post bilateral maxillary antrostomy. Mild mucosal thickening is seen in the right maxillary sinus. There is extensive mucosal thickening in the left  maxillary. Mild ethmoid air cell disease is noted. CT CERVICAL SPINE FINDINGS No cervical spine fracture or malalignment is identified. Intervertebral disc space height is maintained. The facet joints are unremarkable. IMPRESSION: Contusions about the face with a laceration above the left eye. Negative for facial bone fracture. Negative head and cervical spine CT scans. Punctate radiopaque foreign body in the superficial subcutaneous tissues just lateral to the right eye of unknown chronicity. Left much worse than right maxillary sinus mucosal thickening. The patient is status post bilateral maxillary antrostomy. Electronically Signed   By: Drusilla Kanner M.D.   On: 06/24/2015 14:51   Ct Maxillofacial Wo Cm  06/24/2015  CLINICAL DATA:  Status post fall downstairs with a blow to the head last night. Laceration left eyebrow. Initial encounter. EXAM: CT HEAD WITHOUT CONTRAST CT MAXILLOFACIAL WITHOUT CONTRAST CT CERVICAL SPINE WITHOUT CONTRAST TECHNIQUE: Multidetector CT imaging of the head, cervical spine, and maxillofacial structures were performed using the standard protocol without intravenous contrast. Multiplanar CT image reconstructions of the cervical spine and maxillofacial structures were also generated. COMPARISON:  None. FINDINGS: CT HEAD FINDINGS There is no evidence of acute intracranial abnormality including hemorrhage, infarct, mass lesion, mass effect, midline shift or abnormal extra-axial fluid collection. No hydrocephalus or pneumocephalus. The calvarium is intact. CT MAXILLOFACIAL FINDINGS The mandibular condyles are located. No facial bone fracture is seen. Punctate radiopaque foreign body is seen in the shallow subcutaneous tissues lateral to the right eye. Hematoma and laceration about the left eye are seen and there is hematoma over the right maxilla. The globes are intact and the lenses are located. Orbital fat is clear. Optic nerves and extraocular muscles appear normal. The patient is  status post bilateral maxillary antrostomy. Mild mucosal thickening is seen in the right maxillary sinus. There is extensive mucosal thickening in the left maxillary. Mild ethmoid air cell disease is noted. CT CERVICAL SPINE FINDINGS No cervical spine fracture or malalignment is identified. Intervertebral disc space height is maintained. The facet joints are unremarkable. IMPRESSION: Contusions about the face with a laceration above the left eye. Negative for facial bone fracture. Negative head and cervical spine CT scans. Punctate radiopaque foreign body in the superficial subcutaneous tissues just lateral to the right eye of unknown chronicity. Left much worse than right maxillary sinus mucosal thickening. The patient is status post bilateral maxillary antrostomy. Electronically Signed   By: Drusilla Kanner M.D.   On: 06/24/2015 14:51    ____________________________________________    PROCEDURES  Procedure(s) performed:   LACERATION REPAIR Performed by: Racheal Patches Authorized by: Delorise Royals Jerrold Haskell Consent: Verbal consent obtained. Risks and benefits: risks, benefits and alternatives were discussed Consent given by: patient Patient identity confirmed: provided demographic data Prepped and Draped in normal sterile fashion Wound explored  Laceration Location: L forehead  Laceration Length: 2.5 cm  No Foreign Bodies seen or palpated  Anesthesia: local infiltration  Local anesthetic: lidocaine 1 % with epinephrine  Anesthetic total: 7 ml  Irrigation method: syringe Amount of cleaning: standard  Skin closure: 6-0 ethilon sutures  Number of sutures: 9  Technique: simple interrupted  Patient tolerance: Patient tolerated the procedure well with no immediate  complications.     Medications  oxyCODONE-acetaminophen (PERCOCET/ROXICET) 5-325 MG per tablet 1 tablet (1 tablet Oral Given 06/24/15 1601)  lidocaine-EPINEPHrine (XYLOCAINE-EPINEPHrine) 1 %-1:200000 (PF)  injection 30 mL (30 mLs Infiltration Given 06/24/15 1613)     ____________________________________________   INITIAL IMPRESSION / ASSESSMENT AND PLAN / ED COURSE  Pertinent labs & imaging results that were available during my care of the patient were reviewed by me and considered in my medical decision making (see chart for details).  Patient's diagnosis is consistent with syncope causing a fall with multiple facial contusions, and laceration over the left eye. CT scans of the head, face, and neck are negative for acute osseous abnormality. No intracranial hemorrhaging. Laceration is closed as described above.. Patient will be discharged home with prescriptions for pain medication. Patient is to follow up with primary care provider in one week for suture removal. Patient is given ED precautions to return to the ED for any worsening or new symptoms.     ____________________________________________  FINAL CLINICAL IMPRESSION(S) / ED DIAGNOSES  Final diagnoses:  Fall (on) (from) other stairs and steps, initial encounter  Syncope and collapse  Facial contusion, initial encounter  Facial laceration, initial encounter      NEW MEDICATIONS STARTED DURING THIS VISIT:  New Prescriptions   OXYCODONE-ACETAMINOPHEN (ROXICET) 5-325 MG TABLET    Take 1 tablet by mouth every 6 (six) hours as needed for severe pain.        Delorise Royals Sidrah Harden, PA-C 06/24/15 1732  Sharyn Creamer, MD 06/26/15 0747  Delorise Royals Jodey Burbano, PA-C 07/18/15 0981  Sharyn Creamer, MD 07/23/15 [phone]

## 2015-06-24 NOTE — Discharge Instructions (Signed)
Facial Laceration ° A facial laceration is a cut on the face. These injuries can be painful and cause bleeding. Lacerations usually heal quickly, but they need special care to reduce scarring. °DIAGNOSIS  °Your health care provider will take a medical history, ask for details about how the injury occurred, and examine the wound to determine how deep the cut is. °TREATMENT  °Some facial lacerations may not require closure. Others may not be able to be closed because of an increased risk of infection. The risk of infection and the chance for successful closure will depend on various factors, including the amount of time since the injury occurred. °The wound may be cleaned to help prevent infection. If closure is appropriate, pain medicines may be given if needed. Your health care provider will use stitches (sutures), wound glue (adhesive), or skin adhesive strips to repair the laceration. These tools bring the skin edges together to allow for faster healing and a better cosmetic outcome. If needed, you may also be given a tetanus shot. °HOME CARE INSTRUCTIONS °· Only take over-the-counter or prescription medicines as directed by your health care provider. °· Follow your health care provider's instructions for wound care. These instructions will vary depending on the technique used for closing the wound. °For Sutures: °· Keep the wound clean and dry.   °· If you were given a bandage (dressing), you should change it at least once a day. Also change the dressing if it becomes wet or dirty, or as directed by your health care provider.   °· Wash the wound with soap and water 2 times a day. Rinse the wound off with water to remove all soap. Pat the wound dry with a clean towel.   °· After cleaning, apply a thin layer of the antibiotic ointment recommended by your health care provider. This will help prevent infection and keep the dressing from sticking.   °· You may shower as usual after the first 24 hours. Do not soak the  wound in water until the sutures are removed.   °· Get your sutures removed as directed by your health care provider. With facial lacerations, sutures should usually be taken out after 4-5 days to avoid stitch marks.   °· Wait a few days after your sutures are removed before applying any makeup. °For Skin Adhesive Strips: °· Keep the wound clean and dry.   °· Do not get the skin adhesive strips wet. You may bathe carefully, using caution to keep the wound dry.   °· If the wound gets wet, pat it dry with a clean towel.   °· Skin adhesive strips will fall off on their own. You may trim the strips as the wound heals. Do not remove skin adhesive strips that are still stuck to the wound. They will fall off in time.   °For Wound Adhesive: °· You may briefly wet your wound in the shower or bath. Do not soak or scrub the wound. Do not swim. Avoid periods of heavy sweating until the skin adhesive has fallen off on its own. After showering or bathing, gently pat the wound dry with a clean towel.   °· Do not apply liquid medicine, cream medicine, ointment medicine, or makeup to your wound while the skin adhesive is in place. This may loosen the film before your wound is healed.   °· If a dressing is placed over the wound, be careful not to apply tape directly over the skin adhesive. This may cause the adhesive to be pulled off before the wound is healed.   °· Avoid   prolonged exposure to sunlight or tanning lamps while the skin adhesive is in place. °· The skin adhesive will usually remain in place for 5-10 days, then naturally fall off the skin. Do not pick at the adhesive film.   °After Healing: °Once the wound has healed, cover the wound with sunscreen during the day for 1 full year. This can help minimize scarring. Exposure to ultraviolet light in the first year will darken the scar. It can take 1-2 years for the scar to lose its redness and to heal completely.  °SEEK MEDICAL CARE IF: °· You have a fever. °SEEK IMMEDIATE  MEDICAL CARE IF: °· You have redness, pain, or swelling around the wound.   °· You see a yellowish-white fluid (pus) coming from the wound.   °  °This information is not intended to replace advice given to you by your health care provider. Make sure you discuss any questions you have with your health care provider. °  °Document Released: 05/19/2004 Document Revised: 05/02/2014 Document Reviewed: 11/22/2012 °Elsevier Interactive Patient Education ©2016 [employer] ° °Contusion °A contusion is a deep bruise. Contusions happen when an injury causes bleeding under the skin. Symptoms of bruising include pain, swelling, and discolored skin. The skin may turn blue, purple, or yellow. °HOME CARE  °· Rest the injured area. °· If told, put ice on the injured area. °¨ Put ice in a plastic bag. °¨ Place a towel between your skin and the bag. °¨ Leave the ice on for 20 minutes, 2-3 times per day. °· If told, put light pressure (compression) on the injured area using an elastic bandage. Make sure the bandage is not too tight. Remove it and put it back on as told by your doctor. °· If possible, raise (elevate) the injured area above the level of your heart while you are sitting or lying down. °· Take over-the-counter and prescription medicines only as told by your doctor. °GET HELP IF: °· Your symptoms do not get better after several days of treatment. °· Your symptoms get worse. °· You have trouble moving the injured area. °GET HELP RIGHT AWAY IF:  °· You have very bad pain. °· You have a loss of feeling (numbness) in a hand or foot. °· Your hand or foot turns pale or cold. °  °This information is not intended to replace advice given to you by your health care provider. Make sure you discuss any questions you have with your health care provider. °  °Document Released: 09/28/2007 Document Revised: 12/31/2014 Document Reviewed: 08/27/2014 °Elsevier Interactive Patient Education ©2016 [employer] ° °

## 2015-06-24 NOTE — ED Notes (Signed)
Reports fell and hit head last pm.  Lac to left eyebrow area.

## 2015-06-24 NOTE — ED Notes (Addendum)
Pt reports unknown cause of injury, reports he blacked out. Pt with bilateral eye swelling and bruising, laceration to left eyebrown at 0200am. Pt denies any neck pain, reports tetanus was last year.

## 2016-02-24 ENCOUNTER — Emergency Department
Admission: EM | Admit: 2016-02-24 | Discharge: 2016-02-24 | Disposition: A | Discharge status: Home / Self Care | Payer: Medicaid Other | Attending: Emergency Medicine | Admitting: Emergency Medicine

## 2016-02-24 ENCOUNTER — Encounter: Payer: Self-pay | Admitting: Emergency Medicine

## 2016-02-24 ENCOUNTER — Emergency Department: Payer: Medicaid Other

## 2016-02-24 DIAGNOSIS — W2209XA Striking against other stationary object, initial encounter: Secondary | ICD-10-CM | POA: Diagnosis not present

## 2016-02-24 DIAGNOSIS — S92511A Displaced fracture of proximal phalanx of right lesser toe(s), initial encounter for closed fracture: Secondary | ICD-10-CM | POA: Insufficient documentation

## 2016-02-24 DIAGNOSIS — F17200 Nicotine dependence, unspecified, uncomplicated: Secondary | ICD-10-CM | POA: Insufficient documentation

## 2016-02-24 DIAGNOSIS — Y999 Unspecified external cause status: Secondary | ICD-10-CM | POA: Insufficient documentation

## 2016-02-24 DIAGNOSIS — Y939 Activity, unspecified: Secondary | ICD-10-CM | POA: Diagnosis not present

## 2016-02-24 DIAGNOSIS — Y929 Unspecified place or not applicable: Secondary | ICD-10-CM | POA: Insufficient documentation

## 2016-02-24 DIAGNOSIS — S92501A Displaced unspecified fracture of right lesser toe(s), initial encounter for closed fracture: Secondary | ICD-10-CM

## 2016-02-24 DIAGNOSIS — S99921A Unspecified injury of right foot, initial encounter: Secondary | ICD-10-CM | POA: Diagnosis present

## 2016-02-24 MED ORDER — OXYCODONE-ACETAMINOPHEN 5-325 MG PO TABS: 1.0000 | ORAL_TABLET | ORAL | 0 refills | Status: DC | PRN

## 2016-02-24 MED ORDER — OXYCODONE-ACETAMINOPHEN 5-325 MG PO TABS
1.0000 | ORAL_TABLET | Freq: Once | ORAL | Status: AC
  Administered 2016-02-24: 1 via ORAL
  Filled 2016-02-24: qty 1

## 2016-02-24 NOTE — ED Notes (Signed)
Pt verbalized understanding of discharge instructions. NAD at this time. 

## 2016-02-24 NOTE — ED Triage Notes (Signed)
Pt arrives ambulatory to triage with reports of stomping his toe in a door frame this am  Pt states  "I think my toe is broken"  Right sided smallest toe

## 2016-02-24 NOTE — ED Provider Notes (Signed)
[facility] Emergency Department Provider Note   ____________________________________________   First MD Initiated Contact with Patient 02/24/16 1426     (approximate)  I have reviewed the triage vital signs and the nursing notes.   HISTORY  Chief Complaint Toe Pain   HPI Matthew Byrd is a 40 y.o. male is here with complaint of right fifth toe injury during the night. Patient states that he hit his toe on the door frame this morning causing his toe to pull away from his foot. Patient states that he put his work shoes on and went to work until he could not stand the pain. He is taken over-the-counter medication without any relief of his pain. Patient denies any previous injury to his foot. Patient states that pain is worse with weightbearing. Patient rates his pain as a 10 over 10. Patient has someone coming to pick him up.   No past medical history on file.  There are no active problems to display for this patient.   No past surgical history on file.  Prior to Admission medications   Medication Sig Start Date End Date Taking? Authorizing Provider  oxyCODONE-acetaminophen (PERCOCET) 5-325 MG tablet Take 1 tablet by mouth every 4 (four) hours as needed for severe pain. 02/24/16   Tommi Rumpshonda L Summers, PA-C    Allergies Hydrocil [psyllium] and Hydrocodone  No family history on file.  Social History Social History  Substance Use Topics  . Smoking status: Current Every Day Smoker  . Smokeless tobacco: Never Used  . Alcohol use Yes    Review of Systems Constitutional: No fever/chills Eyes: No visual changes. ENT: No sore throat. Cardiovascular: Denies chest pain. Respiratory: Denies shortness of breath. Gastrointestinal:  No nausea, no vomiting.   Musculoskeletal: Positive for right fifth toe pain. Skin: Negative for rash. Neurological: Negative for headaches, focal weakness or numbness.  10-point ROS otherwise  negative.  ____________________________________________   PHYSICAL EXAM:  VITAL SIGNS: ED Triage Vitals  Enc Vitals Group     BP 02/24/16 1337 118/78     Pulse Rate 02/24/16 1337 82     Resp 02/24/16 1337 18     Temp 02/24/16 1337 98.5 F (36.9 C)     Temp Source 02/24/16 1337 Oral     SpO2 02/24/16 1337 99 %     Weight 02/24/16 1337 165 lb (74.8 kg)     Height 02/24/16 1337 5\' 9"  (1.753 m)     Head Circumference --      Peak Flow --      Pain Score 02/24/16 1350 10     Pain Loc --      Pain Edu? --      Excl. in GC? --     Constitutional: Alert and oriented. Well appearing and in no acute distress. Eyes: Conjunctivae are normal. PERRL. EOMI. Head: Atraumatic. Nose: No congestion/rhinnorhea. Neck: No stridor.   Cardiovascular: Normal rate, regular rhythm. Grossly normal heart sounds.  Good peripheral circulation. Respiratory: Normal respiratory effort.  No retractions. Lungs CTAB. Musculoskeletal: Exam of the right foot there is moderate ecchymosis of the fifth digit at the base. And extending to mid metatarsal area on the dorsal aspect of the foot. Range of motion is decreased secondary to pain. There is moderate swelling of the fifth toe. Motor sensory function intact. Capillary refill is less than 3 seconds. Neurologic:  Normal speech and language. No gross focal neurologic deficits are appreciated. No gait instability. Skin:  Skin is warm,  dry and intact. Ecchymosis is present right fifth digit of the foot. No abrasions were noted. Psychiatric: Mood and affect are normal. Speech and behavior are normal.  ____________________________________________   LABS (all labs ordered are listed, but only abnormal results are displayed)  Labs Reviewed - No data to display  RADIOLOGY  Right foot x-ray per radiologist: IMPRESSION:  Oblique fracture of the fifth proximal phalanx without displacement  or angulation.  I, Tommi Rumpshonda L Summers, personally viewed and evaluated these  images (plain radiographs) as part of my medical decision making, as well as reviewing the written report by the radiologist.  ____________________________________________   PROCEDURES  Procedure(s) performed: None  Procedures  Critical Care performed: No  ____________________________________________   INITIAL IMPRESSION / ASSESSMENT AND PLAN / ED COURSE  Pertinent labs & imaging results that were available during my care of the patient were reviewed by me and considered in my medical decision making (see chart for details).    Clinical Course   Buddy tape of the fourth and fifth digit was done and patient was put in a postop shoe for support and protection. Patient is to follow-up with the podiatrist on call Dr. Ether GriffinsFowler. He was given a prescription for Norco as needed for pain. 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 He is also to get a note from Dr. Ether GriffinsFowler.  ____________________________________________   FINAL CLINICAL IMPRESSION(S) / ED DIAGNOSES  Final diagnoses:  Closed fracture of phalanx of right fifth toe, initial encounter      NEW MEDICATIONS STARTED DURING THIS VISIT:  Discharge Medication List as of 02/24/2016  3:52 PM    START taking these medications   Details  oxyCODONE-acetaminophen (PERCOCET) 5-325 MG tablet Take 1 tablet by mouth every 4 (four) hours as needed for severe pain., Starting Wed 02/24/2016, Print         Note:  This document was prepared using Dragon voice recognition software and may include unintentional dictation errors.    Tommi RumpsRhonda L Summers, PA-C 02/24/16 1641    Nita Sicklearolina Veronese, MD 02/25/16 2055

## 2016-02-24 NOTE — Discharge Instructions (Signed)
Ice and elevate right foot to reduce swelling. Buddy tape fourth and fifth toe for extra support. Wear postop shoe for support and protection. Percocet as needed for pain. Call and make an appointment with the podiatrist Dr. Ether GriffinsFowler.

## 2016-06-12 ENCOUNTER — Encounter: Payer: Self-pay | Admitting: Emergency Medicine

## 2016-06-12 ENCOUNTER — Emergency Department
Admission: EM | Admit: 2016-06-12 | Discharge: 2016-06-12 | Disposition: A | Discharge status: Home / Self Care | Payer: Medicaid Other | Attending: Emergency Medicine | Admitting: Emergency Medicine

## 2016-06-12 ENCOUNTER — Emergency Department: Payer: Medicaid Other

## 2016-06-12 DIAGNOSIS — Y999 Unspecified external cause status: Secondary | ICD-10-CM | POA: Diagnosis not present

## 2016-06-12 DIAGNOSIS — Y929 Unspecified place or not applicable: Secondary | ICD-10-CM | POA: Insufficient documentation

## 2016-06-12 DIAGNOSIS — Y9301 Activity, walking, marching and hiking: Secondary | ICD-10-CM | POA: Diagnosis not present

## 2016-06-12 DIAGNOSIS — W268XXA Contact with other sharp object(s), not elsewhere classified, initial encounter: Secondary | ICD-10-CM | POA: Insufficient documentation

## 2016-06-12 DIAGNOSIS — S81812A Laceration without foreign body, left lower leg, initial encounter: Secondary | ICD-10-CM | POA: Diagnosis not present

## 2016-06-12 DIAGNOSIS — F17210 Nicotine dependence, cigarettes, uncomplicated: Secondary | ICD-10-CM | POA: Insufficient documentation

## 2016-06-12 MED ORDER — LIDOCAINE HCL (PF) 1 % IJ SOLN
INTRAMUSCULAR | Status: AC
  Administered 2016-06-12: 5 mL
  Filled 2016-06-12: qty 5

## 2016-06-12 MED ORDER — ONDANSETRON 4 MG PO TBDP
4.0000 mg | ORAL_TABLET | Freq: Once | ORAL | Status: AC
  Administered 2016-06-12: 4 mg via ORAL
  Filled 2016-06-12: qty 1

## 2016-06-12 MED ORDER — OXYCODONE-ACETAMINOPHEN 5-325 MG PO TABS
1.0000 | ORAL_TABLET | Freq: Once | ORAL | Status: AC
  Administered 2016-06-12: 1 via ORAL
  Filled 2016-06-12: qty 1

## 2016-06-12 MED ORDER — CEPHALEXIN 500 MG PO CAPS: 500.0000 mg | ORAL_CAPSULE | Freq: Four times a day (QID) | ORAL | 0 refills | Status: AC

## 2016-06-12 MED ORDER — LIDOCAINE HCL 1 % IJ SOLN
5.0000 mL | Freq: Once | INTRAMUSCULAR | Status: AC
  Administered 2016-06-12: 5 mL
  Filled 2016-06-12: qty 5

## 2016-06-12 MED ORDER — NAPROXEN 500 MG PO TBEC: 500.0000 mg | DELAYED_RELEASE_TABLET | Freq: Two times a day (BID) | ORAL | 0 refills | Status: AC

## 2016-06-12 NOTE — ED Notes (Signed)
NAD noted at time of D/C. Pt denies questions or concerns. Pt ambulatory to the lobby at this time.  

## 2016-06-12 NOTE — ED Provider Notes (Addendum)
[facility] Emergency Department Provider Note  ____________________________________________  Time seen: Approximately 4:06 PM  I have reviewed the triage vital signs and the nursing notes.   HISTORY  Chief Complaint Laceration    HPI Matthew Byrd is a 41 y.o. male presenting to the emergency department with a left shank laceration just medial to the Achilles tendon. Patient states that he "stepped on a deer head mount" while attempting to walk to his room. Patient states that he fell during the process. However, he denies hitting his head or loss of consciousness. Patient has been ambulating. Patient rates laceration pain at 10 out of 10 in intensity. Prior to presenting to the emergency department, he had attempted no alleviating measures besides the application of a clean dressing. Patient denies chest pain, chest tightness, shortness of breath, abdominal pain, nausea and vomiting, back pain and weakness.     History reviewed. No pertinent past medical history.  There are no active problems to display for this patient.   History reviewed. No pertinent surgical history.  Prior to Admission medications   Medication Sig Start Date End Date Taking? Authorizing Provider  cephALEXin (KEFLEX) 500 MG capsule Take 1 capsule (500 mg total) by mouth 4 (four) times daily. 06/12/16 06/22/16  Matthew Feil, PA-C  naproxen (EC NAPROSYN) 500 MG EC tablet Take 1 tablet (500 mg total) by mouth 2 (two) times daily with a meal. 06/12/16 06/12/17  Matthew Feil, PA-C  oxyCODONE-acetaminophen (PERCOCET) 5-325 MG tablet Take 1 tablet by mouth every 4 (four) hours as needed for severe pain. 02/24/16   Tommi Rumps, PA-C    Allergies Hydrocil [psyllium] and Hydrocodone  No family history on file.  Social History Social History  Substance Use Topics  . Smoking status: Current Every Day Smoker    Packs/day: 1.00    Types: Cigarettes  . Smokeless tobacco: Never  Used  . Alcohol use Yes     Review of Systems  Constitutional: No fever/chills Eyes: No visual changes. No discharge ENT: No upper respiratory complaints. Cardiovascular: no chest pain. Respiratory: no cough. No SOB. Gastrointestinal: No abdominal pain.  No nausea, no vomiting.  No diarrhea.  No constipation. Genitourinary: Negative for dysuria. No hematuria Musculoskeletal: Negative for musculoskeletal pain. Skin: Patient has left shank laceration. Neurological: Negative for headaches, focal weakness or numbness.  ____________________________________________   PHYSICAL EXAM:  VITAL SIGNS: ED Triage Vitals [06/12/16 1348]  Enc Vitals Group     BP 112/72     Pulse Rate 77     Resp 18     Temp 98.9 F (37.2 C)     Temp Source Oral     SpO2 98 %     Weight 160 lb (72.6 kg)     Height 5\' 9"  (1.753 m)     Head Circumference      Peak Flow      Pain Score 10     Pain Loc      Pain Edu?      Excl. in GC?    Constitutional: Alert and oriented. Well appearing and in no acute distress. Eyes: Conjunctivae are normal. PERRL. EOMI. Head: Atraumatic. Hematological/Lymphatic/Immunilogical: No cervical lymphadenopathy. Cardiovascular: Normal rate, regular rhythm. Normal S1 and S2.  Good peripheral circulation. Respiratory: Normal respiratory effort without tachypnea or retractions. Lungs CTAB. Good air entry to the bases with no decreased or absent breath sounds. Musculoskeletal: Patient has 5 out of 5 strength in the lower extremities bilaterally. Patient  is able to perform dorsi flexion and plantar flexion at the left ankle. Left Achilles tendon is intact. Patient has some tenderness to deep palpation along the medial proximal achilles tendon. Palpable radial, ulnar and dorsalis pedis pulses bilaterally and symmetrically. Neurologic:  Normal speech and language. No gross focal neurologic deficits are appreciated. Reflexes are 2+ and symmetric in the lower extremities  bilaterally. Skin:  Patient has a 4 cm vertical laceration without significant skin maceration of the skin overlying the left shank, just medial to the Achilles tendon. Psychiatric: Mood and affect are normal. Speech and behavior are normal. Patient exhibits appropriate insight and judgement.   ____________________________________________   LABS (all labs ordered are listed, but only abnormal results are displayed)  Labs Reviewed - No data to display ____________________________________________  EKG   ____________________________________________  RADIOLOGY Matthew Byrd, personally viewed and evaluated these images (plain radiographs) as part of my medical decision making, as well as reviewing the written report by the radiologist.  Dg Ankle Complete Left  Result Date: 06/12/2016 CLINICAL DATA:  Laceration after fall into deer mount EXAM: LEFT ANKLE COMPLETE - 3+ VIEW COMPARISON:  None. FINDINGS: Frontal, oblique, and lateral views were obtained. There is no evident radiopaque foreign body. No fracture. No joint effusion. Ankle mortise appears intact. Joint spaces are normal. No erosive change. There is evidence of soft tissue injury posterior to the distal tibia and fibula. IMPRESSION: No fracture or arthropathic change. No radiopaque foreign body. Soft tissue injury posteriorly. Ankle mortise appears intact. Electronically Signed   By: Bretta Bang Byrd M.D.   On: 06/12/2016 15:42    ____________________________________________    PROCEDURES  Procedure(s) performed:    Procedures    Medications  oxyCODONE-acetaminophen (PERCOCET/ROXICET) 5-325 MG per tablet 1 tablet (1 tablet Oral Given 06/12/16 1500)  ondansetron (ZOFRAN-ODT) disintegrating tablet 4 mg (4 mg Oral Given 06/12/16 1537)  lidocaine (XYLOCAINE) 1 % (with pres) injection 5 mL (5 mLs Infiltration Given by Other 06/12/16 1657)   LACERATION REPAIR Performed by: Matthew Feil Authorized by: Matthew Feil Consent: Verbal consent obtained. Risks and benefits: risks, benefits and alternatives were discussed Consent given by: patient Patient identity confirmed: provided demographic data Prepped and Draped in normal sterile fashion Wound explored  Laceration Location: Left shank  Laceration Length: 4 cm  No Foreign Bodies seen or palpated  Anesthesia: local infiltration  Local anesthetic: lidocaine 1% without epinephrine  Anesthetic total: 5 ml  Irrigation method: syringe Amount of cleaning: standard  Skin closure:  Superficial: 4-0 Ethilon  Deep: 4-0 Raptide  Number of sutures:  Superficial 10 Deep: 4  Technique:  Superficial: Simple Interrupted Deep: Subcutaneous interrupted.   Patient tolerance: Patient tolerated the procedure well with no immediate complications.  ____________________________________________   INITIAL IMPRESSION / ASSESSMENT AND PLAN / ED COURSE  Pertinent labs & imaging results that were available during my care of the patient were reviewed by me and considered in my medical decision making (see chart for details).  Review of the [place] CSRS was performed in accordance of the [facility] prior to dispensing any controlled drugs.     Assessment and Plan: Laceration of left lower leg without complication, initial encounter  Patient presents to the emergency department with a laceration sustained to the left shank, just medial to the Achilles tendon. Patient underwent laceration repair without complications. Patient was discharged with Keflex. On physical exam, patient had some mild tenderness to palpation along the left, proximal, Achilles tendon. A referral was  made to orthopedics, Dr. Martha ClanKrasinski. Patient was advised to make an appointment in one week if proximal Achilles tendon tenderness persists. Patient was advised to have his superficial sutures removed in 9 days. Patient requested "oxycodone" at discharge. I explained that oxycodone was not the  standard of care in the management of laceration pain. Patient voiced understanding. All patient questions were answered.    ____________________________________________  FINAL CLINICAL IMPRESSION(S) / ED DIAGNOSES  Final diagnoses:  Laceration of left lower leg with complication, initial encounter      NEW MEDICATIONS STARTED DURING THIS VISIT:  Discharge Medication List as of 06/12/2016  4:58 PM    START taking these medications   Details  cephALEXin (KEFLEX) 500 MG capsule Take 1 capsule (500 mg total) by mouth 4 (four) times daily., Starting Sun 06/12/2016, Until Wed 06/22/2016, Print    naproxen (EC NAPROSYN) 500 MG EC tablet Take 1 tablet (500 mg total) by mouth 2 (two) times daily with a meal., Starting Sun 06/12/2016, Until Mon 06/12/2017, Print            This chart was dictated using voice recognition software/Dragon. Despite best efforts to proofread, errors can occur which can change the meaning. Any change was purely unintentional.    Matthew FeilJaclyn M Siboney Requejo, PA-C 06/12/16 1707    Matthew SemenGraydon Goodman, MD 06/12/16 2049    Matthew FeilJaclyn M Briton Sellman, PA-C 06/13/16 0030    Matthew SemenGraydon Goodman, MD 06/14/16 1130

## 2016-06-12 NOTE — ED Notes (Signed)
Pt presents to ED with c/o laceration to L ankle. Pt states "I stepped on a deer head mount". Pt presents with approx 2 inch laceration to posterior calf. Bleeding is controlled at this time.

## 2016-06-12 NOTE — ED Triage Notes (Signed)
Patient presents to the ED with a 3in long laceration to his left lower leg.  Patient states he fell into a "european deer mount".  Patient is in no obvious distress at this time.  Area is not actively bleeding.

## 2016-08-06 ENCOUNTER — Emergency Department
Admission: EM | Admit: 2016-08-06 | Discharge: 2016-08-06 | Disposition: A | Discharge status: Home / Self Care | Payer: Medicaid Other | Attending: Emergency Medicine | Admitting: Emergency Medicine

## 2016-08-06 ENCOUNTER — Emergency Department: Payer: Medicaid Other

## 2016-08-06 ENCOUNTER — Encounter: Payer: Self-pay | Admitting: Emergency Medicine

## 2016-08-06 DIAGNOSIS — W2209XA Striking against other stationary object, initial encounter: Secondary | ICD-10-CM | POA: Diagnosis not present

## 2016-08-06 DIAGNOSIS — Y939 Activity, unspecified: Secondary | ICD-10-CM | POA: Diagnosis not present

## 2016-08-06 DIAGNOSIS — F17210 Nicotine dependence, cigarettes, uncomplicated: Secondary | ICD-10-CM | POA: Insufficient documentation

## 2016-08-06 DIAGNOSIS — Y999 Unspecified external cause status: Secondary | ICD-10-CM | POA: Diagnosis not present

## 2016-08-06 DIAGNOSIS — Y929 Unspecified place or not applicable: Secondary | ICD-10-CM | POA: Diagnosis not present

## 2016-08-06 DIAGNOSIS — S62306A Unspecified fracture of fifth metacarpal bone, right hand, initial encounter for closed fracture: Secondary | ICD-10-CM | POA: Diagnosis not present

## 2016-08-06 DIAGNOSIS — S6991XA Unspecified injury of right wrist, hand and finger(s), initial encounter: Secondary | ICD-10-CM | POA: Diagnosis present

## 2016-08-06 DIAGNOSIS — S62339A Displaced fracture of neck of unspecified metacarpal bone, initial encounter for closed fracture: Secondary | ICD-10-CM

## 2016-08-06 MED ORDER — OXYCODONE-ACETAMINOPHEN 5-325 MG PO TABS
1.0000 | ORAL_TABLET | Freq: Once | ORAL | Status: AC
  Administered 2016-08-06: 1 via ORAL
  Filled 2016-08-06: qty 1

## 2016-08-06 MED ORDER — OXYCODONE-ACETAMINOPHEN 5-325 MG PO TABS: 1.0000 | ORAL_TABLET | Freq: Four times a day (QID) | ORAL | 0 refills | Status: DC | PRN

## 2016-08-06 NOTE — ED Provider Notes (Signed)
[facility] Emergency Department Provider Note  ____________________________________________  Time seen: Approximately 7:50 PM  I have reviewed the triage vital signs and the nursing notes.   HISTORY  Chief Complaint Hand Injury    HPI Matthew Byrd is a 41 y.o. male who presents emergency department complaining of right hand pain. Patient reports that he became angry this evening and in frustration he struck the door frame. Patient reports that he had sudden, immediate pain to the medial aspect of the hand. He reports swelling to the same region. He denies any nose tingling of any digit of his hand. Full range of motion all digits. No medications prior to arrival.   History reviewed. No pertinent past medical history.  There are no active problems to display for this patient.   History reviewed. No pertinent surgical history.  Prior to Admission medications   Medication Sig Start Date End Date Taking? Authorizing Provider  naproxen (EC NAPROSYN) 500 MG EC tablet Take 1 tablet (500 mg total) by mouth 2 (two) times daily with a meal. 06/12/16 06/12/17  Orvil Feil, PA-C  oxyCODONE-acetaminophen (ROXICET) 5-325 MG tablet Take 1 tablet by mouth every 6 (six) hours as needed for severe pain. 08/06/16   Delorise Royals Cuthriell, PA-C    Allergies Hydrocil [psyllium] and Hydrocodone  History reviewed. No pertinent family history.  Social History Social History  Substance Use Topics  . Smoking status: Current Every Day Smoker    Packs/day: 1.00    Types: Cigarettes  . Smokeless tobacco: Never Used  . Alcohol use Yes     Review of Systems  Constitutional: No fever/chills Eyes: No visual changes.  Cardiovascular: no chest pain. Respiratory: no cough. No SOB. Gastrointestinal: No abdominal pain.  No nausea, no vomiting.  Musculoskeletal: Positive for right hand pain Skin: Negative for rash, abrasions, lacerations, ecchymosis. Neurological:  Negative for headaches, focal weakness or numbness. 10-point ROS otherwise negative.  ____________________________________________   PHYSICAL EXAM:  VITAL SIGNS: ED Triage Vitals  Enc Vitals Group     BP 08/06/16 1833 112/84     Pulse Rate 08/06/16 1833 86     Resp 08/06/16 1833 18     Temp 08/06/16 1833 99.2 F (37.3 C)     Temp Source 08/06/16 1833 Oral     SpO2 08/06/16 1833 95 %     Weight 08/06/16 1834 170 lb (77.1 kg)     Height 08/06/16 1834  (1.753 m)     Head Circumference --      Peak Flow --      Pain Score 08/06/16 1831 10     Pain Loc --      Pain Edu? --      Excl. in GC? --      Constitutional: Alert and oriented. Well appearing and in no acute distress. Eyes: Conjunctivae are normal. PERRL. EOMI. Head: Atraumatic. Neck: No stridor.    Cardiovascular: Normal rate, regular rhythm. Normal S1 and S2.  Good peripheral circulation. Respiratory: Normal respiratory effort without tachypnea or retractions. Lungs CTAB. Good air entry to the bases with no decreased or absent breath sounds. Musculoskeletal: Full range of motion to all extremities. No gross deformities appreciated.No deformities noted to have but inspection. Edema is noted to the base of fifth metacarpal. Patient has full range of motion all digits. Full range of motion to the wrist. Patient is extremely tender to palpation of the base of the fifth metacarpal. Palpable abnormality in this region.  Sensation intact 5 digits. Neurologic:  Normal speech and language. No gross focal neurologic deficits are appreciated.  Skin:  Skin is warm, dry and intact. No rash noted. Psychiatric: Mood and affect are normal. Speech and behavior are normal. Patient exhibits appropriate insight and judgement.   ____________________________________________   LABS (all labs ordered are listed, but only abnormal results are displayed)  Labs Reviewed - No data to  display ____________________________________________  EKG   ____________________________________________  RADIOLOGY Festus Barren Cuthriell, personally viewed and evaluated these images (plain radiographs) as part of my medical decision making, as well as reviewing the written report by the radiologist.  Dg Hand Complete Right  Result Date: 08/06/2016 CLINICAL DATA:  Hit hand on door today with pain and swelling, initial encounter EXAM: RIGHT HAND - COMPLETE 3+ VIEW COMPARISON:  None. FINDINGS: A fracture through the base of the fifth metacarpal is noted with mild displacement. Degenerative changes of the radiocarpal articulation are noted likely related prior trauma. Soft tissue swelling is noted consistent with the recent fracture. IMPRESSION: Fracture in the base of the fifth metacarpal. Electronically Signed   By: Alcide Clever M.D.   On: 08/06/2016 19:46    ____________________________________________    PROCEDURES  Procedure(s) performed:    Procedures    Medications  oxyCODONE-acetaminophen (PERCOCET/ROXICET) 5-325 MG per tablet 1 tablet (1 tablet Oral Given 08/06/16 2016)     ____________________________________________   INITIAL IMPRESSION / ASSESSMENT AND PLAN / ED COURSE  Pertinent labs & imaging results that were available during my care of the patient were reviewed by me and considered in my medical decision making (see chart for details).  Review of the [place] CSRS was performed in accordance of the [facility] prior to dispensing any controlled drugs.     Patient's diagnosis is consistent with boxer's fracture to the right hand. This is stabilized in the emergency department with ulnar gutter splint. Exam is reassuring with no indication for further imaging or labs. She was given limited pain medication. Patient follow-up with the orthopedics..  Patient is given ED precautions to return to the ED for any worsening or new  symptoms.     ____________________________________________  FINAL CLINICAL IMPRESSION(S) / ED DIAGNOSES  Final diagnoses:  Closed boxer's fracture, initial encounter      NEW MEDICATIONS STARTED DURING THIS VISIT:  New Prescriptions   OXYCODONE-ACETAMINOPHEN (ROXICET) 5-325 MG TABLET    Take 1 tablet by mouth every 6 (six) hours as needed for severe pain.        This chart was dictated using voice recognition software/Dragon. Despite best efforts to proofread, errors can occur which can change the meaning. Any change was purely unintentional.    Racheal Patches, PA-C 08/06/16 2024    Rockne Menghini, MD 08/09/16 1511

## 2016-08-06 NOTE — ED Triage Notes (Signed)
Pt states he hit his right hand on a door today around 5pm when he got upset.  Pain 10/10.

## 2016-08-06 NOTE — ED Notes (Signed)

## 2016-09-10 ENCOUNTER — Emergency Department
Admission: EM | Admit: 2016-09-10 | Discharge: 2016-09-10 | Disposition: A | Discharge status: Home / Self Care | Payer: Medicaid Other | Attending: Emergency Medicine | Admitting: Emergency Medicine

## 2016-09-10 ENCOUNTER — Encounter: Payer: Self-pay | Admitting: Medical Oncology

## 2016-09-10 DIAGNOSIS — F17210 Nicotine dependence, cigarettes, uncomplicated: Secondary | ICD-10-CM | POA: Insufficient documentation

## 2016-09-10 DIAGNOSIS — M79641 Pain in right hand: Secondary | ICD-10-CM | POA: Insufficient documentation

## 2016-09-10 NOTE — ED Triage Notes (Signed)
Pt reports he had cast placed to rt hand Tuesday and yesterday noted that his hand was more painful and swollen. Pt moves fingers and color is appropriate.

## 2016-09-10 NOTE — ED Notes (Signed)
Signature pad not working. Pt angry that pa won't give him narcotics. Advised pt to follow up with surgeon. Pt left

## 2016-09-10 NOTE — Discharge Instructions (Signed)
Monitor cast for sensation. Follow up with Orthopedics on Monday.

## 2016-09-10 NOTE — ED Notes (Signed)
Pt states this is his 2nd cast - placed by surgeon in [place]. Pt states the first one was too loose and he feels this one is too tight. Good cap refill in fingers. No swelling noted.

## 2016-09-10 NOTE — ED Provider Notes (Signed)
[facility] Emergency Department Provider Note   ____________________________________________   I have reviewed the triage vital signs and the nursing notes.   HISTORY  Chief Complaint Hand Problem    HPI Matthew Byrd is a 41 y.o. male presents with right hand and finger pain, numbess secondary to increased swelling over the last 24 hours. Patient reports having a second cast placed on his right hand by orthopedics in [place]. Patient is status post open reduction external fixation fracture repair of 5th metacarpal. Pt reports the first cast became too loose and now the second has become tight causing current symptoms.  Patient denies fever, chills, headache, vision changes, chest pain, chest tightness, shortness of breath, abdominal pain, nausea and vomiting.  History reviewed. No pertinent past medical history.  There are no active problems to display for this patient.   History reviewed. No pertinent surgical history.  Prior to Admission medications   Medication Sig Start Date End Date Taking? Authorizing Provider  naproxen (EC NAPROSYN) 500 MG EC tablet Take 1 tablet (500 mg total) by mouth 2 (two) times daily with a meal. 06/12/16 06/12/17  Orvil Feil, PA-C  oxyCODONE-acetaminophen (ROXICET) 5-325 MG tablet Take 1 tablet by mouth every 6 (six) hours as needed for severe pain. 08/06/16   Cuthriell, Delorise Royals, PA-C    Allergies Hydrocil [psyllium] and Hydrocodone  No family history on file.  Social History Social History  Substance Use Topics  . Smoking status: Current Every Day Smoker    Packs/day: 1.00    Types: Cigarettes  . Smokeless tobacco: Never Used  . Alcohol use Yes    Review of Systems Constitutional: Negative for fever/chills Eyes: No visual changes. ENT:  Negative for sore throat.   Cardiovascular: Denies chest pain. Respiratory:  Denies shortness of breath. Musculoskeletal: Right hand and fingers pain and  numbness Skin: Negative for rash. Neurological: Negative for headaches. Right hand and finger numbness ____________________________________________   PHYSICAL EXAM:  VITAL SIGNS: ED Triage Vitals  Enc Vitals Group     BP 09/10/16 1620 118/84     Pulse Rate 09/10/16 1620 81     Resp 09/10/16 1620 18     Temp 09/10/16 1620 98.1 F (36.7 C)     Temp Source 09/10/16 1620 Oral     SpO2 09/10/16 1620 97 %     Weight 09/10/16 1619 170 lb (77.1 kg)     Height --      Head Circumference --      Peak Flow --      Pain Score 09/10/16 1740 10     Pain Loc --      Pain Edu? --      Excl. in GC? --     Constitutional: Alert and oriented. Well appearing and in no acute distress.  Head: Normocephalic and atraumatic. Eyes: Conjunctivae are normal.  Cardiovascular: Normal rate, regular rhythm. Normal distal pulses. Respiratory: Normal respiratory effort.  Musculoskeletal: Right hand pain and sensation decreased along digits 1 through 4 and the thumb secondary to swelling constricting neuro vasculature of the right hand after cast placement.  Neurologic: Normal speech and language. No gross focal neurologic deficits are appreciated except right hand and digits 1 through 4, thumb numbness numbness.  Skin:  Skin is warm, dry and intact. No rash noted. Psychiatric: Mood and affect are normal. ____________________________________________   LABS (all labs ordered are listed, but only abnormal results are displayed)  Labs Reviewed - No data  to display ____________________________________________  EKG none ____________________________________________  RADIOLOGY none ____________________________________________   PROCEDURES  Procedure(s) performed:  SPLINT APPLICATION Date/Time: 9:49 PM Authorized by: Clois Comberraci M Shariya Gaster Consent: Verbal consent obtained. Risks and benefits: risks, benefits and alternatives were discussed Consent given by: patient Splint applied by: Ennifer Harston,  PA-C Location details: R hand, forearm Splint type: Ulnar gutter splint Supplies used: Ortho glass with ACE wrap Post-procedure: The splinted body part was neurovascularly unchanged following the procedure. Patient tolerance: Patient tolerated the procedure well with no immediate complications.   Critical Care performed: no ____________________________________________   INITIAL IMPRESSION / ASSESSMENT AND PLAN / ED COURSE  Pertinent labs & imaging results that were available during my care of the patient were reviewed by me and considered in my medical decision making (see chart for details).   Patient presented with right hand, thumb and fingers 1-4 pain and numbness secondary to swelling constricting neuro vasculature after second cast placement by Orthopedics. Patient is status post open reduction and external fixation of the fifth metacarpal. Physical exam findings confirm pain and sensation changes otherwise patient found stable. Cast removed no additional findings noted. Ulnar gutter Ortho-Glass with Ace wrap splint reapplied to the right hand. Neurovascular assessment confirmed recovery of sensation, decrease in pain in the right hand fingers and thumb. Patient requested narcotics for pain management. Patient was informed to communicate with his orthopedic surgeon for all narcotic medication refills. Offered patient other pain management options patient refused those options and requested to be discharged. Recommended patient follow-up with his orthopedic surgeon for continued care.   Patient informed of clinical course, understand medical decision-making process, and agree with plan.  Patient was advised to follow up with Orthopedics and was also advised to return to the emergency department for symptoms that change or worsen if unable to schedule an appointment.      ____________________________________________   FINAL CLINICAL IMPRESSION(S) / ED DIAGNOSES  Final diagnoses:   Right hand pain       NEW MEDICATIONS STARTED DURING THIS VISIT:  Discharge Medication List as of 09/10/2016  5:35 PM       Note:  This document was prepared using Dragon voice recognition software and may include unintentional dictation errors.   Percell BostonLittle, Darnita Woodrum M, PA-C 09/10/16 2211    Sharman CheekStafford, Phillip, MD 09/11/16 [phone]

## 2016-09-10 NOTE — ED Notes (Signed)
Cast removed by pa and splint placed by pa

## 2016-12-05 ENCOUNTER — Emergency Department
Admission: EM | Admit: 2016-12-05 | Discharge: 2016-12-05 | Disposition: A | Discharge status: Home / Self Care | Payer: Medicaid Other | Attending: Emergency Medicine | Admitting: Emergency Medicine

## 2016-12-05 ENCOUNTER — Emergency Department: Payer: Medicaid Other

## 2016-12-05 ENCOUNTER — Encounter: Payer: Self-pay | Admitting: Emergency Medicine

## 2016-12-05 DIAGNOSIS — F17210 Nicotine dependence, cigarettes, uncomplicated: Secondary | ICD-10-CM | POA: Insufficient documentation

## 2016-12-05 DIAGNOSIS — Y939 Activity, unspecified: Secondary | ICD-10-CM | POA: Insufficient documentation

## 2016-12-05 DIAGNOSIS — Y929 Unspecified place or not applicable: Secondary | ICD-10-CM | POA: Insufficient documentation

## 2016-12-05 DIAGNOSIS — M50122 Cervical disc disorder at C5-C6 level with radiculopathy: Secondary | ICD-10-CM | POA: Diagnosis not present

## 2016-12-05 DIAGNOSIS — M5412 Radiculopathy, cervical region: Secondary | ICD-10-CM

## 2016-12-05 DIAGNOSIS — Y990 Civilian activity done for income or pay: Secondary | ICD-10-CM | POA: Insufficient documentation

## 2016-12-05 DIAGNOSIS — W19XXXA Unspecified fall, initial encounter: Secondary | ICD-10-CM

## 2016-12-05 DIAGNOSIS — W182XXA Fall in (into) shower or empty bathtub, initial encounter: Secondary | ICD-10-CM | POA: Insufficient documentation

## 2016-12-05 DIAGNOSIS — S20211A Contusion of right front wall of thorax, initial encounter: Secondary | ICD-10-CM | POA: Diagnosis not present

## 2016-12-05 DIAGNOSIS — S299XXA Unspecified injury of thorax, initial encounter: Secondary | ICD-10-CM | POA: Diagnosis present

## 2016-12-05 DIAGNOSIS — J449 Chronic obstructive pulmonary disease, unspecified: Secondary | ICD-10-CM | POA: Diagnosis not present

## 2016-12-05 HISTORY — DX: Unspecified osteoarthritis, unspecified site: M19.90

## 2016-12-05 HISTORY — DX: Chronic obstructive pulmonary disease, unspecified: J44.9

## 2016-12-05 MED ORDER — TRAMADOL HCL 50 MG PO TABS: 50.0000 mg | ORAL_TABLET | Freq: Four times a day (QID) | ORAL | 0 refills | Status: DC | PRN

## 2016-12-05 MED ORDER — METHYLPREDNISOLONE 4 MG PO TBPK: ORAL_TABLET | ORAL | 0 refills | Status: DC

## 2016-12-05 MED ORDER — CYCLOBENZAPRINE HCL 10 MG PO TABS: 10.0000 mg | ORAL_TABLET | Freq: Three times a day (TID) | ORAL | 0 refills | Status: DC | PRN

## 2016-12-05 MED ORDER — DEXAMETHASONE SODIUM PHOSPHATE 10 MG/ML IJ SOLN
10.0000 mg | Freq: Once | INTRAMUSCULAR | Status: AC
  Administered 2016-12-05: 10 mg via INTRAMUSCULAR
  Filled 2016-12-05: qty 1

## 2016-12-05 MED ORDER — KETOROLAC TROMETHAMINE 60 MG/2ML IM SOLN
60.0000 mg | Freq: Once | INTRAMUSCULAR | Status: AC
  Administered 2016-12-05: 60 mg via INTRAMUSCULAR
  Filled 2016-12-05: qty 2

## 2016-12-05 MED ORDER — TRAMADOL HCL 50 MG PO TABS
50.0000 mg | ORAL_TABLET | Freq: Once | ORAL | Status: AC
  Administered 2016-12-05: 50 mg via ORAL
  Filled 2016-12-05: qty 1

## 2016-12-05 NOTE — ED Notes (Signed)
Pt discharged to home.  Family member driving.  Discharge instructions reviewed.  Verbalized understanding.  No questions or concerns at this time.  Teach back verified.  Pt in NAD.  No items left in ED.   

## 2016-12-05 NOTE — ED Provider Notes (Signed)
[facility] Emergency Department Provider Note   ____________________________________________   None    (approximate)  I have reviewed the triage vital signs and the nursing notes.   HISTORY  Chief Complaint Fall    HPI Matthew Byrd is a 41 y.o. male patient complaining of pain secondary to a fall yesterday. Patient states that he fell in a shower track his right ribs and hip. Patient complaining of pain with deep inspiration and coughing. She also complaining of bilateral hand numbness. Patient stated this was onset was before his fall. Patient rates his pain as a 10 over 10. No palliative measures for complaint. Patient denies LOC or head injury.  Past Medical History:  Diagnosis Date  . Arthritis   . COPD (chronic obstructive pulmonary disease) (HCC)     There are no active problems to display for this patient.   History reviewed. No pertinent surgical history.  Prior to Admission medications   Medication Sig Start Date End Date Taking? Authorizing Provider  cyclobenzaprine (FLEXERIL) 10 MG tablet Take 1 tablet (10 mg total) by mouth 3 (three) times daily as needed. 12/05/16   Joni Reining, PA-C  methylPREDNISolone (MEDROL DOSEPAK) 4 MG TBPK tablet Take Tapered dose as directed 12/05/16   Joni Reining, PA-C  naproxen (EC NAPROSYN) 500 MG EC tablet Take 1 tablet (500 mg total) by mouth 2 (two) times daily with a meal. 06/12/16 06/12/17  Orvil Feil, PA-C  oxyCODONE-acetaminophen (ROXICET) 5-325 MG tablet Take 1 tablet by mouth every 6 (six) hours as needed for severe pain. 08/06/16   Cuthriell, Delorise Royals, PA-C  traMADol (ULTRAM) 50 MG tablet Take 1 tablet (50 mg total) by mouth every 6 (six) hours as needed. 12/05/16 12/05/17  Joni Reining, PA-C    Allergies Hydrocil [psyllium] and Hydrocodone  No family history on file.  Social History Social History  Substance Use Topics  . Smoking status: Current Every Day Smoker   Packs/day: 1.00    Types: Cigarettes  . Smokeless tobacco: Never Used  . Alcohol use Yes    Review of Systems  Constitutional: No fever/chills Eyes: No visual changes. ENT: No sore throat. Cardiovascular: Denies chest pain. Respiratory: Denies shortness of breath. Gastrointestinal: No abdominal pain.  No nausea, no vomiting.  No diarrhea.  No constipation. Genitourinary: Negative for dysuria. Musculoskeletal: Negative for back pain. Skin: Negative for rash. Neurological: Negative for headaches, focal weakness or numbness. Allergic/Immunilogical: See medication list  ____________________________________________   PHYSICAL EXAM:  VITAL SIGNS: ED Triage Vitals  Enc Vitals Group     BP 12/05/16 1203 109/64     Pulse Rate 12/05/16 1203 75     Resp 12/05/16 1203 18     Temp 12/05/16 1203 98.3 F (36.8 C)     Temp Source 12/05/16 1203 Oral     SpO2 12/05/16 1203 96 %     Weight 12/05/16 1204 170 lb (77.1 kg)     Height 12/05/16 1204 5\' 9"  (1.753 m)     Head Circumference --      Peak Flow --      Pain Score 12/05/16 1205 10     Pain Loc --      Pain Edu? --      Excl. in GC? --     Constitutional: Alert and oriented. Well appearing and in no acute distress. Neck: No stridor. No cervical spine tenderness to palpation. Hematological/Lymphatic/Immunilogical: No cervical lymphadenopathy. Cardiovascular: Normal rate, regular rhythm. Grossly  normal heart sounds.  Good peripheral circulation. Respiratory: Normal respiratory effort.  No retractions. Lungs CTAB. Gastrointestinal: Soft and nontender. No distention. No abdominal bruits. No CVA tenderness. Musculoskeletal: No lower extremity tenderness nor edema.  No joint effusions. Neurologic:  Normal speech and language. No gross focal neurologic deficits are appreciated. No gait instability. Skin:  Skin is warm, dry and intact. No rash noted. No abrasion or ecchymosis. Psychiatric: Mood and affect are normal. Speech and behavior  are normal.  ____________________________________________   LABS (all labs ordered are listed, but only abnormal results are displayed)  Labs Reviewed - No data to display ____________________________________________  EKG   ____________________________________________  RADIOLOGY  Dg Ribs Unilateral W/chest Right  Result Date: 12/05/2016 CLINICAL DATA:  Fall in shower 1 day ago. Right lateral rib pain. Initial encounter. EXAM: RIGHT RIBS AND CHEST - 3+ VIEW COMPARISON:  03/18/2012 chest x-ray FINDINGS: No fracture or other bone lesions are seen involving the ribs. There is no evidence of pneumothorax or pleural effusion. Both lungs are clear. Heart size and mediastinal contours are within normal limits. IMPRESSION: Negative. Electronically Signed   By: Marnee SpringJonathon  Watts M.D.   On: 12/05/2016 12:44   Dg Cervical Spine Complete  Result Date: 12/05/2016 CLINICAL DATA:  Patient fell 1 day ago. EXAM: CERVICAL SPINE - COMPLETE 4+ VIEW COMPARISON:  Down FINDINGS: There is no evidence of cervical spine fracture or prevertebral soft tissue swelling. Alignment is normal. No other significant bone abnormalities are identified. IMPRESSION: Negative cervical spine radiographs. Electronically Signed   By: Kennith CenterEric  Mansell M.D.   On: 12/05/2016 14:14    ____________________________________________   PROCEDURES  Procedure(s) performed: None  Procedures  Critical Care performed: No  ____________________________________________   INITIAL IMPRESSION / ASSESSMENT AND PLAN / ED COURSE  Pertinent labs & imaging results that were available during my care of the patient were reviewed by me and considered in my medical decision making (see chart for details).  Cervical radiculopathy and chest wall contusion. Discussed negative x-ray finding with patient. Patient given discharge care instructions. Patient advised to follow up with PCP for continual  care.  ____________________________________________   FINAL CLINICAL IMPRESSION(S) / ED DIAGNOSES  Final diagnoses:  Cervical radiculopathy at C6  Fall, initial encounter  Rib contusion, right, initial encounter      NEW MEDICATIONS STARTED DURING THIS VISIT:  New Prescriptions   CYCLOBENZAPRINE (FLEXERIL) 10 MG TABLET    Take 1 tablet (10 mg total) by mouth 3 (three) times daily as needed.   METHYLPREDNISOLONE (MEDROL DOSEPAK) 4 MG TBPK TABLET    Take Tapered dose as directed   TRAMADOL (ULTRAM) 50 MG TABLET    Take 1 tablet (50 mg total) by mouth every 6 (six) hours as needed.     Note:  This document was prepared using Dragon voice recognition software and may include unintentional dictation errors.    Joni ReiningSmith, Areon Cocuzza K, PA-C 12/05/16 1445    Nita SickleVeronese, [place], MD 12/06/16 1125

## 2016-12-05 NOTE — ED Triage Notes (Signed)
Pt fell in the shower x1 day, pain on deep breaths and coughing spells. Pt complaining of bilateral hand numbness  sharp shooting pain

## 2017-09-18 ENCOUNTER — Emergency Department: Payer: No Typology Code available for payment source

## 2017-09-18 ENCOUNTER — Encounter: Payer: Self-pay | Admitting: Emergency Medicine

## 2017-09-18 ENCOUNTER — Other Ambulatory Visit: Payer: Self-pay

## 2017-09-18 ENCOUNTER — Emergency Department
Admission: EM | Admit: 2017-09-18 | Discharge: 2017-09-18 | Disposition: A | Discharge status: Home / Self Care | Payer: No Typology Code available for payment source | Attending: Emergency Medicine | Admitting: Emergency Medicine

## 2017-09-18 DIAGNOSIS — Y92410 Unspecified street and highway as the place of occurrence of the external cause: Secondary | ICD-10-CM | POA: Insufficient documentation

## 2017-09-18 DIAGNOSIS — Y999 Unspecified external cause status: Secondary | ICD-10-CM | POA: Insufficient documentation

## 2017-09-18 DIAGNOSIS — Y939 Activity, unspecified: Secondary | ICD-10-CM | POA: Insufficient documentation

## 2017-09-18 DIAGNOSIS — R112 Nausea with vomiting, unspecified: Secondary | ICD-10-CM | POA: Insufficient documentation

## 2017-09-18 DIAGNOSIS — S0083XA Contusion of other part of head, initial encounter: Secondary | ICD-10-CM | POA: Diagnosis not present

## 2017-09-18 DIAGNOSIS — F17210 Nicotine dependence, cigarettes, uncomplicated: Secondary | ICD-10-CM | POA: Diagnosis not present

## 2017-09-18 DIAGNOSIS — J449 Chronic obstructive pulmonary disease, unspecified: Secondary | ICD-10-CM | POA: Diagnosis not present

## 2017-09-18 DIAGNOSIS — R51 Headache: Secondary | ICD-10-CM | POA: Diagnosis not present

## 2017-09-18 DIAGNOSIS — Z79899 Other long term (current) drug therapy: Secondary | ICD-10-CM | POA: Insufficient documentation

## 2017-09-18 DIAGNOSIS — S5001XA Contusion of right elbow, initial encounter: Secondary | ICD-10-CM | POA: Diagnosis not present

## 2017-09-18 MED ORDER — TRAMADOL HCL 50 MG PO TABS
ORAL_TABLET | ORAL | Status: AC
  Administered 2017-09-18: 50 mg via ORAL
  Filled 2017-09-18: qty 1

## 2017-09-18 MED ORDER — TRAMADOL HCL 50 MG PO TABS
50.0000 mg | ORAL_TABLET | Freq: Once | ORAL | Status: AC
  Administered 2017-09-18: 50 mg via ORAL

## 2017-09-18 MED ORDER — TRAMADOL HCL 50 MG PO TABS: 50.0000 mg | ORAL_TABLET | Freq: Four times a day (QID) | ORAL | 0 refills | Status: DC | PRN

## 2017-09-18 NOTE — ED Notes (Addendum)
Pt back from CT

## 2017-09-18 NOTE — ED Notes (Signed)
Pt alert and oriented X4, active, cooperative, pt in NAD. RR even and unlabored, color WNL.  Pt informed to return if any life threatening symptoms occur.  Discharge and followup instructions reviewed. Pt crunched up discharge papers given by this RN, gave them to another member in room and asked them to throw them away.
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
## 2017-09-18 NOTE — ED Notes (Signed)
ED Provider at bedside. 

## 2019-12-03 ENCOUNTER — Emergency Department: Payer: HRSA Program

## 2019-12-03 ENCOUNTER — Other Ambulatory Visit: Payer: Self-pay

## 2019-12-03 ENCOUNTER — Emergency Department
Admission: EM | Admit: 2019-12-03 | Discharge: 2019-12-03 | Disposition: A | Discharge status: Home / Self Care | Payer: HRSA Program | Attending: Student in an Organized Health Care Education/Training Program | Admitting: Student in an Organized Health Care Education/Training Program

## 2019-12-03 DIAGNOSIS — J449 Chronic obstructive pulmonary disease, unspecified: Secondary | ICD-10-CM | POA: Insufficient documentation

## 2019-12-03 DIAGNOSIS — U071 COVID-19: Secondary | ICD-10-CM | POA: Insufficient documentation

## 2019-12-03 DIAGNOSIS — F17210 Nicotine dependence, cigarettes, uncomplicated: Secondary | ICD-10-CM | POA: Diagnosis not present

## 2019-12-03 DIAGNOSIS — M5412 Radiculopathy, cervical region: Secondary | ICD-10-CM | POA: Diagnosis not present

## 2019-12-03 DIAGNOSIS — R531 Weakness: Secondary | ICD-10-CM | POA: Diagnosis present

## 2019-12-03 LAB — CBC
HCT: 51.6 % (ref 39.0–52.0)
Hemoglobin: 17.9 g/dL — ABNORMAL HIGH (ref 13.0–17.0)
MCH: 34 pg (ref 26.0–34.0)
MCHC: 34.7 g/dL (ref 30.0–36.0)
MCV: 98.1 fL (ref 80.0–100.0)
Platelets: 174 10*3/uL (ref 150–400)
RBC: 5.26 MIL/uL (ref 4.22–5.81)
RDW: 13.9 % (ref 11.5–15.5)
WBC: 8.4 10*3/uL (ref 4.0–10.5)
nRBC: 0 % (ref 0.0–0.2)

## 2019-12-03 LAB — HEPATIC FUNCTION PANEL
ALT: 34 U/L (ref 0–44)
AST: 62 U/L — ABNORMAL HIGH (ref 15–41)
Albumin: 4.3 g/dL (ref 3.5–5.0)
Alkaline Phosphatase: 93 U/L (ref 38–126)
Bilirubin, Direct: 0.2 mg/dL (ref 0.0–0.2)
Indirect Bilirubin: 0.6 mg/dL (ref 0.3–0.9)
Total Bilirubin: 0.8 mg/dL (ref 0.3–1.2)
Total Protein: 7.7 g/dL (ref 6.5–8.1)

## 2019-12-03 LAB — BASIC METABOLIC PANEL
Anion gap: 12 (ref 5–15)
BUN: 5 mg/dL — ABNORMAL LOW (ref 6–20)
CO2: 26 mmol/L (ref 22–32)
Calcium: 8.9 mg/dL (ref 8.9–10.3)
Chloride: 98 mmol/L (ref 98–111)
Creatinine, Ser: 0.9 mg/dL (ref 0.61–1.24)
GFR calc Af Amer: >60 mL/min (ref >60)
GFR calc non Af Amer: >60 mL/min (ref >60)
Glucose, Bld: 80 mg/dL (ref 70–99)
Potassium: 3.5 mmol/L (ref 3.5–5.1)
Sodium: 136 mmol/L (ref 135–145)

## 2019-12-03 LAB — URINALYSIS, COMPLETE (UACMP) WITH MICROSCOPIC
Bacteria, UA: NONE SEEN
Bilirubin Urine: NEGATIVE
Glucose, UA: NEGATIVE mg/dL
Ketones, ur: NEGATIVE mg/dL
Leukocytes,Ua: NEGATIVE
Nitrite: NEGATIVE
Protein, ur: NEGATIVE mg/dL
Specific Gravity, Urine: 1.019 (ref 1.005–1.030)
Squamous Epithelial / LPF: NONE SEEN (ref 0–5)
pH: 5 (ref 5.0–8.0)

## 2019-12-03 LAB — GLUCOSE, CAPILLARY: Glucose-Capillary: 75 mg/dL (ref 70–99)

## 2019-12-03 LAB — SARS CORONAVIRUS 2 BY RT PCR (HOSPITAL ORDER, PERFORMED IN ~~LOC~~ HOSPITAL LAB): SARS Coronavirus 2: POSITIVE — AB

## 2019-12-03 LAB — TROPONIN I (HIGH SENSITIVITY): Troponin I (High Sensitivity): 3 ng/L (ref <18)

## 2019-12-03 LAB — LACTIC ACID, PLASMA: Lactic Acid, Venous: 1 mmol/L (ref 0.5–1.9)

## 2019-12-03 MED ORDER — SODIUM CHLORIDE 0.9% FLUSH: 3.0000 mL | Freq: Once | INTRAVENOUS | Status: DC

## 2019-12-03 NOTE — ED Triage Notes (Signed)
Pt c/o HA with cough, congestion, bodyaches, weakness, numbness in my arms, blurry vision, wakes up gagging, indigestion. Pt is in NAD , ambulatory with a steady gait.

## 2019-12-03 NOTE — ED Provider Notes (Signed)
[facility] Emergency Department Provider Note   ____________________________________________   First MD Initiated Contact with Patient 12/03/19 1143     (approximate)  I have reviewed the triage vital signs and the nursing notes.   HISTORY  Chief Complaint Weakness   HPI Matthew Byrd is a 44 y.o. male presents to the ED with multiple complaints.  Patient complains of a dull, frontal headache that he has had for approximately 2 months.  He denies any head injury.  He does report some blurred vision with this but denies dizziness or difficulty standing.  Patient also complains of body aches, weakness, numbness in his upper extremities and gagging when he wakes up in the morning and coughs up phlegm.  He also complains of indigestion that has been going on for 2 months.  He tried a Nexium once without any relief and did not take any other over-the-counter medications.  Patient denies any nausea, vomiting or diarrhea.  Patient continues to smoke 1 pack of cigarettes or more a day and also endorses that he drinks alcohol daily.  Denies any use of recreational drugs.         Past Medical History:  Diagnosis Date  . Arthritis   . COPD (chronic obstructive pulmonary disease) (HCC)     There are no problems to display for this patient.   Past Surgical History:  Procedure Laterality Date  . ELBOW SURGERY    . HAND SURGERY    . WRIST SURGERY      Prior to Admission medications   Not on File    Allergies Hydrocil [psyllium] and Hydrocodone  No family history on file.  Social History Social History   Tobacco Use  . Smoking status: Current Every Day Smoker    Packs/day: 1.00    Types: Cigarettes  . Smokeless tobacco: Never Used  Vaping Use  . Vaping Use: Never used  Substance Use Topics  . Alcohol use: Yes  . Drug use: Not on file    Review of Systems Constitutional: No fever/chills.  Positive weakness. Eyes: Positive blurry  vision. ENT: No sore throat. Cardiovascular: Denies chest pain. Respiratory: Denies shortness of breath.  Positive cough. Gastrointestinal: No abdominal pain.  Positive for indigestion.  No nausea, no vomiting.  No diarrhea.  Genitourinary: Negative for dysuria. Musculoskeletal: Positive body aches.  Positive upper extremity radiculopathy. Skin: Negative for rash. Neurological: Negative for headaches, focal weakness or numbness. Psychiatric:  Nicotine and alcohol abuse. ____________________________________________   PHYSICAL EXAM:  VITAL SIGNS: ED Triage Vitals  Enc Vitals Group     BP 12/03/19 0833 114/84     Pulse Rate 12/03/19 0833 87     Resp 12/03/19 0833 18     Temp 12/03/19 0833 100.2 F (37.9 C)     Temp Source 12/03/19 0833 Oral     SpO2 12/03/19 0833 95 %     Weight 12/03/19 0834 170 lb (77.1 kg)     Height 12/03/19 0834 5\' 9"  (1.753 m)     Head Circumference --      Peak Flow --      Pain Score 12/03/19 0833 0     Pain Loc --      Pain Edu? --      Excl. in GC? --     Constitutional: Alert and oriented. Well appearing and in no acute distress. Eyes: Conjunctivae are normal. PERRL. EOMI. no nystagmus. Head: Atraumatic. Nose: No congestion/rhinnorhea. Neck: No stridor.   Cardiovascular:  Normal rate, regular rhythm. Grossly normal heart sounds.  Good peripheral circulation. Respiratory: Normal respiratory effort.  No retractions. Lungs CTAB. Gastrointestinal: Soft and nontender. No distention.  No tenderness noted in the epigastric region.  Bowel sounds are normoactive x4 quadrants.  No point tenderness or rebound pain appreciated.  Patient at this time would prefer to defer rectal exam and Hemoccult. Musculoskeletal: Moves upper and lower extremities with any difficulty.  Normal gait was noted. Neurologic:  Normal speech and language. No gross focal neurologic deficits are appreciated. No gait instability. Skin:  Skin is warm, dry and intact. No rash  noted. Psychiatric: Mood and affect are normal. Speech and behavior are normal.  ____________________________________________   LABS (all labs ordered are listed, but only abnormal results are displayed)  Labs Reviewed  SARS CORONAVIRUS 2 BY RT PCR (HOSPITAL ORDER, PERFORMED IN [facility]) - Abnormal; Notable for the following components:      Result Value   SARS Coronavirus 2 POSITIVE (*)    All other components within normal limits  BASIC METABOLIC PANEL - Abnormal; Notable for the following components:   BUN 5 (*)    All other components within normal limits  CBC - Abnormal; Notable for the following components:   Hemoglobin 17.9 (*)    All other components within normal limits  URINALYSIS, COMPLETE (UACMP) WITH MICROSCOPIC - Abnormal; Notable for the following components:   Color, Urine YELLOW (*)    APPearance HAZY (*)    Hgb urine dipstick MODERATE (*)    All other components within normal limits  HEPATIC FUNCTION PANEL - Abnormal; Notable for the following components:   AST 62 (*)    All other components within normal limits  GLUCOSE, CAPILLARY  LACTIC ACID, PLASMA  LACTIC ACID, PLASMA  CBG MONITORING, ED  TROPONIN I (HIGH SENSITIVITY)   ____________________________________________  EKG EKG was reviewed by doctor on  the major side of the ED. Normal sinus rhythm with ventricular rate of 84. ____________________________________________  RADIOLOGY    Official radiology report(s): DG Chest 2 View  Result Date: 12/03/2019 CLINICAL DATA:  Cough for 2 months EXAM: CHEST - 2 VIEW COMPARISON:  12/05/2016 FINDINGS: The heart size and mediastinal contours are within normal limits. Both lungs are clear. The visualized skeletal structures are unremarkable. IMPRESSION: No active cardiopulmonary disease. Electronically Signed   By: Alcide Clever M.D.   On: 12/03/2019 12:45   CT Head Wo Contrast  Result Date: 12/03/2019 CLINICAL DATA:  Frontal headache for the  past 2 months with blurry vision. Neck pain. EXAM: CT HEAD WITHOUT CONTRAST CT CERVICAL SPINE WITHOUT CONTRAST TECHNIQUE: Multidetector CT imaging of the head and cervical spine was performed following the standard protocol without intravenous contrast. Multiplanar CT image reconstructions of the cervical spine were also generated. COMPARISON:  CT head and cervical spine dated Sep 18, 2017. FINDINGS: CT HEAD FINDINGS Brain: No evidence of acute infarction, hemorrhage, hydrocephalus, extra-axial collection or mass lesion/mass effect. Vascular: No hyperdense vessel or unexpected calcification. Skull: Normal. Negative for fracture or focal lesion. Sinuses/Orbits: No acute finding. Other: None. CT CERVICAL SPINE FINDINGS Alignment: Straightening of the normal cervical lordosis. Skull base and vertebrae: No acute fracture. No primary bone lesion or focal pathologic process. Soft tissues and spinal canal: No prevertebral fluid or swelling. No visible canal hematoma. Disc levels: Small posterior disc osteophyte complexes at C5-C6 and C6-C7. Scattered mild uncovertebral hypertrophy, most prominent on the left at C3-C4. Findings have progressed since 2019. Upper chest: Mild paraseptal  emphysema. Other: None. IMPRESSION: 1. No acute intracranial abnormality. 2. Mild cervical spondylosis, progressed since 2019. 3. Emphysema (ICD10-J43.9). Electronically Signed   By: Obie DredgeWilliam T Derry M.D.   On: 12/03/2019 14:07   CT Cervical Spine Wo Contrast  Result Date: 12/03/2019 CLINICAL DATA:  Frontal headache for the past 2 months with blurry vision. Neck pain. EXAM: CT HEAD WITHOUT CONTRAST CT CERVICAL SPINE WITHOUT CONTRAST TECHNIQUE: Multidetector CT imaging of the head and cervical spine was performed following the standard protocol without intravenous contrast. Multiplanar CT image reconstructions of the cervical spine were also generated. COMPARISON:  CT head and cervical spine dated Sep 18, 2017. FINDINGS: CT HEAD FINDINGS  Brain: No evidence of acute infarction, hemorrhage, hydrocephalus, extra-axial collection or mass lesion/mass effect. Vascular: No hyperdense vessel or unexpected calcification. Skull: Normal. Negative for fracture or focal lesion. Sinuses/Orbits: No acute finding. Other: None. CT CERVICAL SPINE FINDINGS Alignment: Straightening of the normal cervical lordosis. Skull base and vertebrae: No acute fracture. No primary bone lesion or focal pathologic process. Soft tissues and spinal canal: No prevertebral fluid or swelling. No visible canal hematoma. Disc levels: Small posterior disc osteophyte complexes at C5-C6 and C6-C7. Scattered mild uncovertebral hypertrophy, most prominent on the left at C3-C4. Findings have progressed since 2019. Upper chest: Mild paraseptal emphysema. Other: None. IMPRESSION: 1. No acute intracranial abnormality. 2. Mild cervical spondylosis, progressed since 2019. 3. Emphysema (ICD10-J43.9). Electronically Signed   By: Obie DredgeWilliam T Derry M.D.   On: 12/03/2019 14:07    ____________________________________________   PROCEDURES  Procedure(s) performed (including Critical Care):  Procedures   ____________________________________________   INITIAL IMPRESSION / ASSESSMENT AND PLAN / ED COURSE  As part of my medical decision making, I reviewed the following data within the electronic MEDICAL RECORD NUMBER Notes from prior ED visits and [place] Controlled Substance Database  Matthew Byrd was evaluated in Emergency Department on 12/03/2019 for the symptoms described in the history of present illness. He was evaluated in the context of the global COVID-19 pandemic, which necessitated consideration that the patient might be at risk for infection with the SARS-CoV-2 virus that causes COVID-19. Institutional protocols and algorithms that pertain to the evaluation of patients at risk for COVID-19 are in a state of rapid change based on information released by regulatory bodies including the  CDC and federal and state organizations. These policies and algorithms were followed during the patient's care in the ED.  44 year old male presents to the ED with multiple complaints including cough, congestion, weakness, body aches and headache.  Patient also for a longer period of time has symptoms of cervical radiculopathy bilateral arms.  Patient complains of blurred vision and headache for an undetermined length of time.  Patient continues to smoke greater than 1 pack cigarettes per day and drink alcohol daily approximately 1-2 per history.  Patient was made aware of his lab findings, chest x-ray, cervical spine and head CT.  He is to follow-up with his PCP for any continued problems concerning his cervical radiculopathy.  Patient was made aware prior to discharge that his Covid test is positive.  He agrees to quarantine for an additional 10 days however it is unclear as to when his actual Covid symptoms began.  He is encouraged to discontinue or at least decrease the amount of smoking that he is doing at this time.  Return to the emergency department if any severe worsening of his breathing or shortness of breath that you.  ____________________________________________   FINAL CLINICAL IMPRESSION(S) / ED  DIAGNOSES  Final diagnoses:  COVID-19 virus infection  Cervical radiculopathy     ED Discharge Orders    None       Note:  This document was prepared using Dragon voice recognition software and may include unintentional dictation errors.    Tommi Rumps, PA-C 12/03/19 1551    Willy Eddy, MD 12/05/19 [phone]

## 2019-12-03 NOTE — ED Notes (Signed)
See triage note  Presents with several weeks of intermittent numbness and body aches  Low grade temp also has had some blurry vision

## 2019-12-03 NOTE — Discharge Instructions (Addendum)
Follow-up with your primary care provider at [facility] medical.  Increase fluids.  Tylenol as needed for body aches, headache or fever.  Also discontinue or at least decrease the amount of smoking that you are doing along with your alcohol intake.  Return to the emergency department if any severe worsening of your breathing or shortness of breath.  You will need to quarantine for an additional 10 days and anyone who lives with you.

## 2019-12-20 ENCOUNTER — Emergency Department: Payer: HRSA Program

## 2019-12-20 ENCOUNTER — Other Ambulatory Visit: Payer: Self-pay

## 2019-12-20 ENCOUNTER — Encounter: Payer: Self-pay | Admitting: Emergency Medicine

## 2019-12-20 ENCOUNTER — Observation Stay
Admission: EM | Admit: 2019-12-20 | Discharge: 2019-12-21 | Disposition: A | Discharge status: Home / Self Care | Payer: HRSA Program | Attending: Internal Medicine | Admitting: Internal Medicine

## 2019-12-20 DIAGNOSIS — R4182 Altered mental status, unspecified: Secondary | ICD-10-CM | POA: Diagnosis present

## 2019-12-20 DIAGNOSIS — J1289 Other viral pneumonia: Secondary | ICD-10-CM | POA: Insufficient documentation

## 2019-12-20 DIAGNOSIS — T402X1A Poisoning by other opioids, accidental (unintentional), initial encounter: Secondary | ICD-10-CM | POA: Diagnosis present

## 2019-12-20 DIAGNOSIS — D72829 Elevated white blood cell count, unspecified: Secondary | ICD-10-CM | POA: Diagnosis not present

## 2019-12-20 DIAGNOSIS — F17210 Nicotine dependence, cigarettes, uncomplicated: Secondary | ICD-10-CM | POA: Diagnosis not present

## 2019-12-20 DIAGNOSIS — T50904A Poisoning by unspecified drugs, medicaments and biological substances, undetermined, initial encounter: Secondary | ICD-10-CM

## 2019-12-20 DIAGNOSIS — J449 Chronic obstructive pulmonary disease, unspecified: Secondary | ICD-10-CM | POA: Diagnosis not present

## 2019-12-20 DIAGNOSIS — J9601 Acute respiratory failure with hypoxia: Secondary | ICD-10-CM | POA: Insufficient documentation

## 2019-12-20 DIAGNOSIS — U071 COVID-19: Principal | ICD-10-CM | POA: Insufficient documentation

## 2019-12-20 LAB — CBC
HCT: 41.2 % (ref 39.0–52.0)
Hemoglobin: 13.8 g/dL (ref 13.0–17.0)
MCH: 33.4 pg (ref 26.0–34.0)
MCHC: 33.5 g/dL (ref 30.0–36.0)
MCV: 99.8 fL (ref 80.0–100.0)
Platelets: 284 10*3/uL (ref 150–400)
RBC: 4.13 MIL/uL — ABNORMAL LOW (ref 4.22–5.81)
RDW: 13 % (ref 11.5–15.5)
WBC: 26.4 10*3/uL — ABNORMAL HIGH (ref 4.0–10.5)
nRBC: 0 % (ref 0.0–0.2)

## 2019-12-20 LAB — URINE DRUG SCREEN, QUALITATIVE (ARMC ONLY)
Amphetamines, Ur Screen: NOT DETECTED
Barbiturates, Ur Screen: NOT DETECTED
Benzodiazepine, Ur Scrn: NOT DETECTED
Cannabinoid 50 Ng, Ur ~~LOC~~: POSITIVE — AB
Cocaine Metabolite,Ur ~~LOC~~: POSITIVE — AB
MDMA (Ecstasy)Ur Screen: NOT DETECTED
Methadone Scn, Ur: NOT DETECTED
Opiate, Ur Screen: NOT DETECTED
Phencyclidine (PCP) Ur S: NOT DETECTED
Tricyclic, Ur Screen: NOT DETECTED

## 2019-12-20 LAB — COMPREHENSIVE METABOLIC PANEL
ALT: 37 U/L (ref 0–44)
AST: 62 U/L — ABNORMAL HIGH (ref 15–41)
Albumin: 3.7 g/dL (ref 3.5–5.0)
Alkaline Phosphatase: 90 U/L (ref 38–126)
Anion gap: 9 (ref 5–15)
BUN: 11 mg/dL (ref 6–20)
CO2: 25 mmol/L (ref 22–32)
Calcium: 8.1 mg/dL — ABNORMAL LOW (ref 8.9–10.3)
Chloride: 104 mmol/L (ref 98–111)
Creatinine, Ser: 0.96 mg/dL (ref 0.61–1.24)
GFR calc Af Amer: >60 mL/min (ref >60)
GFR calc non Af Amer: >60 mL/min (ref >60)
Glucose, Bld: 165 mg/dL — ABNORMAL HIGH (ref 70–99)
Potassium: 4.4 mmol/L (ref 3.5–5.1)
Sodium: 138 mmol/L (ref 135–145)
Total Bilirubin: 0.4 mg/dL (ref 0.3–1.2)
Total Protein: 6.8 g/dL (ref 6.5–8.1)

## 2019-12-20 LAB — BLOOD GAS, VENOUS
Acid-base deficit: 0.4 mmol/L (ref 0.0–2.0)
Bicarbonate: 28 mmol/L (ref 20.0–28.0)
O2 Saturation: 84.1 %
Patient temperature: 37
pCO2, Ven: 61 mmHg — ABNORMAL HIGH (ref 44.0–60.0)
pH, Ven: 7.27 (ref 7.250–7.430)
pO2, Ven: 56 mmHg — ABNORMAL HIGH (ref 32.0–45.0)

## 2019-12-20 LAB — SALICYLATE LEVEL: Salicylate Lvl: <7 mg/dL — ABNORMAL LOW (ref 7.0–30.0)

## 2019-12-20 LAB — ETHANOL: Alcohol, Ethyl (B): <10 mg/dL (ref <10)

## 2019-12-20 LAB — ACETAMINOPHEN LEVEL: Acetaminophen (Tylenol), Serum: <10 ug/mL — ABNORMAL LOW (ref 10–30)

## 2019-12-20 MED ORDER — NALOXONE HCL 4 MG/10ML IJ SOLN
0.2500 mg/h | INTRAVENOUS | Status: DC
  Administered 2019-12-20: 0.25 mg/h via INTRAVENOUS
  Filled 2019-12-20: qty 10

## 2019-12-20 MED ORDER — ACETAMINOPHEN 650 MG RE SUPP: 650.0000 mg | Freq: Four times a day (QID) | RECTAL | Status: DC | PRN

## 2019-12-20 MED ORDER — ONDANSETRON HCL 4 MG/2ML IJ SOLN
4.0000 mg | Freq: Once | INTRAMUSCULAR | Status: AC
  Administered 2019-12-20: 4 mg via INTRAVENOUS
  Filled 2019-12-20: qty 2

## 2019-12-20 MED ORDER — ENOXAPARIN SODIUM 40 MG/0.4ML ~~LOC~~ SOLN
40.0000 mg | SUBCUTANEOUS | Status: DC
  Administered 2019-12-20: 40 mg via SUBCUTANEOUS
  Filled 2019-12-20: qty 0.4

## 2019-12-20 MED ORDER — ACETAMINOPHEN 325 MG PO TABS: 650.0000 mg | ORAL_TABLET | Freq: Four times a day (QID) | ORAL | Status: DC | PRN

## 2019-12-20 MED ORDER — FLUTICASONE PROPIONATE 50 MCG/ACT NA SUSP
1.0000 | Freq: Every day | NASAL | Status: DC
  Filled 2019-12-20: qty 16

## 2019-12-20 MED ORDER — NALOXONE HCL 2 MG/2ML IJ SOSY
0.4000 mg | PREFILLED_SYRINGE | Freq: Once | INTRAMUSCULAR | Status: AC
  Administered 2019-12-20: 0.4 mg via INTRAVENOUS
  Filled 2019-12-20: qty 2

## 2019-12-20 MED ORDER — ONDANSETRON HCL 4 MG/2ML IJ SOLN: 4.0000 mg | Freq: Four times a day (QID) | INTRAMUSCULAR | Status: DC | PRN

## 2019-12-20 MED ORDER — ONDANSETRON HCL 4 MG PO TABS: 4.0000 mg | ORAL_TABLET | Freq: Four times a day (QID) | ORAL | Status: DC | PRN

## 2019-12-20 MED ORDER — DIPHENHYDRAMINE HCL 50 MG/ML IJ SOLN: 50.0000 mg | Freq: Once | INTRAMUSCULAR | Status: DC

## 2019-12-20 MED ORDER — ACETAMINOPHEN 500 MG PO TABS
1000.0000 mg | ORAL_TABLET | Freq: Four times a day (QID) | ORAL | Status: DC | PRN
  Administered 2019-12-20: 1000 mg via ORAL
  Filled 2019-12-20: qty 2

## 2019-12-20 MED ORDER — NALOXONE HCL 0.4 MG/ML IJ SOLN
0.2000 mg | Freq: Once | INTRAMUSCULAR | Status: AC
  Administered 2019-12-20: 0.2 mg via INTRAVENOUS

## 2019-12-20 NOTE — ED Triage Notes (Signed)
Overdose of ?heroine.  Had pulse but satting in 30s and cyanotic.  After narcan came awake and vss.  He denies drug use to ems.

## 2019-12-20 NOTE — ED Notes (Signed)
Pharmacy called to send narcan infusion 

## 2019-12-20 NOTE — ED Notes (Signed)
Prior to narcan pt was responsive to verbal stimuli- after narcan pt was sitting up and speaking in complete sentences

## 2019-12-20 NOTE — H&P (Addendum)
[facility] History and Physical   Patient: Matthew Byrd   PCP: [facility] DOB: Mar 10, 1976   DOA: 12/20/2019   DOS: 12/20/2019   DOS: the patient was seen and examined on 12/20/2019  Patient coming from: The patient is coming from Home  Chief Complaint: Drug overdose and confusion  HPI: Matthew Byrd is a 44 y.o. male with Past medical history of COPD. Patient was brought in by the EMS.  Patient was found confused and EMS was called for suspected drug overdose.  Reportedly patient was cyanotic with heart rate in 30s when EMS saw the patient.  Patient was given Narcan with improvement.  Patient denied drug use to EMS.  On arrival patient was somnolent again.  Patient was given 2 more rounds of Narcan given his lethargy between the dose of the Narcan. With the Narcan patient's condition improved to the point that patient was able to ambulate to the desk. At the time of my evaluation patient reported some headache in the front.  Also reported chills.  No fever.  No chest pain no abdominal pain.  No diarrhea.  No nausea no vomiting.  Denies any drug use.  No injury or trauma reported.  ED Course: Multiple rounds of Narcan required to maintain patient awake.  Patient was referred for admission as patient required Narcan drip.  UDS is still pending.  At his baseline ambulates without assistance independent for most of his ADL;  manages his medication on his own.  Review of Systems: as mentioned in the history of present illness.  All other systems reviewed and are negative.  Past Medical History:  Diagnosis Date  . Arthritis   . COPD (chronic obstructive pulmonary disease) (HCC)    Past Surgical History:  Procedure Laterality Date  . ELBOW SURGERY    . HAND SURGERY    . WRIST SURGERY     Social History:  reports that he has been smoking cigarettes. He has been smoking about 1.00 pack per day. He has never used smokeless tobacco. He reports  current alcohol use. No history on file for drug use.  Allergies  Allergen Reactions  . Hydrocil [Psyllium]   . Hydrocodone Itching and Nausea And Vomiting    Unable to review family history given patient's altered mental status.  Prior to Admission medications   Not on File    Physical Exam: Vitals:   12/20/19 1539 12/20/19 1600 12/20/19 1630 12/20/19 1930  BP:  120/70 111/68 103/61  Pulse: (!) 104 79 85 75  Resp:    16  Temp:      TempSrc:      SpO2: 98% 100% 94% 98%  Weight:      Height:        General: lethargic and not oriented to time, place, and person. Appear in moderate distress, affect emotionally labile Eyes: PERRL, Conjunctiva normal ENT: Oral Mucosa Clear, dry  Neck: difficult to assess  JVD, no Abnormal Mass Or lumps Cardiovascular: S1 and S2 Present, no Murmur, peripheral pulses symmetrical Respiratory: increased respiratory effort, Bilateral Air entry equal and Decreased, no signs of accessory muscle use, Clear to Auscultation, no Crackles, no wheezes Abdomen: Bowel Sound present, Soft and no tenderness, no hernia Skin: no rashes  Extremities: no Pedal edema, no calf tenderness Neurologic: without any new focal findings Gait not checked due to patient safety concerns  Data Reviewed: I have personally reviewed and interpreted labs, imaging as discussed below.  CBC: Recent Labs  Lab 12/20/19 1405  WBC 26.4*  HGB 13.8  HCT 41.2  MCV 99.8  PLT 284   Basic Metabolic Panel: Recent Labs  Lab 12/20/19 1405  NA 138  K 4.4  CL 104  CO2 25  GLUCOSE 165*  BUN 11  CREATININE 0.96  CALCIUM 8.1*   GFR: Estimated Creatinine Clearance: 100.2 mL/min (by C-G formula based on SCr of 0.96 mg/dL). Liver Function Tests: Recent Labs  Lab 12/20/19 1405  AST 62*  ALT 37  ALKPHOS 90  BILITOT 0.4  PROT 6.8  ALBUMIN 3.7   No results for input(s): LIPASE, AMYLASE in the last 168 hours. No results for input(s): AMMONIA in the last 168  hours. Coagulation Profile: No results for input(s): INR, PROTIME in the last 168 hours. Cardiac Enzymes: No results for input(s): CKTOTAL, CKMB, CKMBINDEX, TROPONINI in the last 168 hours. BNP (last 3 results) No results for input(s): PROBNP in the last 8760 hours. HbA1C: No results for input(s): HGBA1C in the last 72 hours. CBG: No results for input(s): GLUCAP in the last 168 hours. Lipid Profile: No results for input(s): CHOL, HDL, LDLCALC, TRIG, CHOLHDL, LDLDIRECT in the last 72 hours. Thyroid Function Tests: No results for input(s): TSH, T4TOTAL, FREET4, T3FREE, THYROIDAB in the last 72 hours. Anemia Panel: No results for input(s): VITAMINB12, FOLATE, FERRITIN, TIBC, IRON, RETICCTPCT in the last 72 hours. Urine analysis:    Component Value Date/Time   COLORURINE YELLOW (A) 12/03/2019 1009   APPEARANCEUR HAZY (A) 12/03/2019 1009   APPEARANCEUR Hazy 12/14/2011 2158   LABSPEC 1.019 12/03/2019 1009   LABSPEC 1.031 12/14/2011 2158   PHURINE 5.0 12/03/2019 1009   GLUCOSEU NEGATIVE 12/03/2019 1009   GLUCOSEU Negative 12/14/2011 2158   HGBUR MODERATE (A) 12/03/2019 1009   BILIRUBINUR NEGATIVE 12/03/2019 1009   BILIRUBINUR Negative 12/14/2011 2158   KETONESUR NEGATIVE 12/03/2019 1009   PROTEINUR NEGATIVE 12/03/2019 1009   NITRITE NEGATIVE 12/03/2019 1009   LEUKOCYTESUR NEGATIVE 12/03/2019 1009   LEUKOCYTESUR Negative 12/14/2011 2158    Radiological Exams on Admission: DG Chest Portable 1 View  Result Date: 12/20/2019 CLINICAL DATA:  Shortness of breath EXAM: PORTABLE CHEST 1 VIEW COMPARISON:  12/03/2019 FINDINGS: No new consolidation or edema. Stable cardiomediastinal contours. The visualized skeletal structures are unremarkable. IMPRESSION: No acute process in the chest. Electronically Signed   By: Guadlupe Spanish M.D.   On: 12/20/2019 15:12   EKG: Independently reviewed. normal sinus rhythm, nonspecific ST and T waves changes.  I reviewed all nursing notes, pharmacy notes,  vitals, pertinent old records.  Assessment/Plan 1. Drug overdose Likely opioid overdose. Responding to Narcan. UDS still currently pending. Patient denies any drug abuse. Currently on Narcan drip. We will admit to stepdown unit.  2.Acute COVID-19 Viral Pneumonia CXR: no hazy bilateral peripheral opacities Tmax last 24 hours:  Temp (24hrs), Avg:98.9 F (37.2 C), Min:98.9 F (37.2 C), Max:98.9 F (37.2 C)  Oxygen requirements: on 2 LPM Currently no indication remedesivir or Steroids or Baricitinib/Actemra Vitamin C and Zinc: continue  DVT Prophylaxis: enoxaparin (LOVENOX) injection 40 mg Start: 12/20/19 1700  3.  Acute hypoxic respiratory failure. In the setting of opioid overdose rather than COVID-19 pneumonia. We will continue supportive care for now.  4.  Headache Continue to monitor.  Supportive care with Tylenol.  5. Leucocytosis Likely stress reaction. Monitor.   Nutrition: Regular diet DVT Prophylaxis: Subcutaneous Lovenox  Advance goals of care discussion: Full code   Consults: none   Family Communication: no family was present at bedside,  at the time of interview.   Disposition:  Status is: Observation  The patient remains OBS appropriate and will d/c before 2 midnights.  Dispo: The patient is from: Home              Anticipated d/c is to: Home              Anticipated d/c date is: 1 day              Patient currently is not medically stable to d/c.       Author: Lynden Oxford, MD [facility] 12/20/2019 7:56 PM   To reach On-call, see care teams to locate the attending and reach out to them via [email or web address]. If 7PM-7AM, please contact night-coverage If you still have difficulty reaching the attending provider, please page the [facility] (Director on Call) for [facility] on amion for assistance.

## 2019-12-20 NOTE — ED Provider Notes (Addendum)
[facility] Emergency Department Provider Note    First MD Initiated Contact with Patient 12/20/19 1417     (approximate)  I have reviewed the triage vital signs and the nursing notes.   HISTORY  Chief Complaint Drug Overdose  Level v Caveat; AMS  HPI Matthew Byrd is a 44 y.o. male who presents to the ER for evaluation of altered mental status.  Patient found by EMS reportedly called out for suspected drug overdose.  Was given Narcan with some improvement.  Was found to be hypoxic.  Unknown downtime.  Patient still hypoxic and drowsy denies any drug use.  Was recently tested positive for Covid.    Past Medical History:  Diagnosis Date  . Arthritis   . COPD (chronic obstructive pulmonary disease) (HCC)    History reviewed. No pertinent family history. Past Surgical History:  Procedure Laterality Date  . ELBOW SURGERY    . HAND SURGERY    . WRIST SURGERY     There are no problems to display for this patient.     Prior to Admission medications   Not on File    Allergies Hydrocil [psyllium] and Hydrocodone    Social History Social History   Tobacco Use  . Smoking status: Current Every Day Smoker    Packs/day: 1.00    Types: Cigarettes  . Smokeless tobacco: Never Used  Vaping Use  . Vaping Use: Never used  Substance Use Topics  . Alcohol use: Yes  . Drug use: Not on file    Review of Systems Patient denies headaches, rhinorrhea, blurry vision, numbness, shortness of breath, chest pain, edema, cough, abdominal pain, nausea, vomiting, diarrhea, dysuria, fevers, rashes or hallucinations unless otherwise stated above in HPI. ____________________________________________   PHYSICAL EXAM:  VITAL SIGNS: Vitals:   12/20/19 1402 12/20/19 1410  BP: 106/69   Pulse: (!) 108   Resp: 12   Temp: 98.9 F (37.2 C)   SpO2: (!) 83% 98%    Constitutional: drowsy, slow respirations with hypoxia requiring supplemental o2 Eyes:  Conjunctivae are normal.  Head: Atraumatic. Nose: No congestion/rhinnorhea. Mouth/Throat: Mucous membranes are moist.   Neck: No stridor. Painless ROM.  Cardiovascular: Normal rate, regular rhythm. Grossly normal heart sounds.  Good peripheral circulation. Respiratory: Normal respiratory effort.  No retractions.lungs with diminished bibasilar BS.  Gastrointestinal: Soft and nontender. No distention. No abdominal bruits. No CVA tenderness. Genitourinary:  Musculoskeletal: No lower extremity tenderness nor edema.  No joint effusions. Neurologic:  No gross focal neurologic deficits are appreciated. No facial droop Skin:  Skin is warm, dry and intact. No rash noted. Psychiatric: unable to assess 2/2 ams  ____________________________________________   LABS (all labs ordered are listed, but only abnormal results are displayed)  Results for orders placed or performed during the hospital encounter of 12/20/19 (from the past 24 hour(s))  Comprehensive metabolic panel     Status: Abnormal   Collection Time: 12/20/19  2:05 PM  Result Value Ref Range   Sodium 138 135 - 145 mmol/L   Potassium 4.4 3.5 - 5.1 mmol/L   Chloride 104 98 - 111 mmol/L   CO2 25 22 - 32 mmol/L   Glucose, Bld 165 (H) 70 - 99 mg/dL   BUN 11 6 - 20 mg/dL   Creatinine, Ser 2.35 0.61 - 1.24 mg/dL   Calcium 8.1 (L) 8.9 - 10.3 mg/dL   Total Protein 6.8 6.5 - 8.1 g/dL   Albumin 3.7 3.5 - 5.0 g/dL   AST  62 (H) 15 - 41 U/L   ALT 37 0 - 44 U/L   Alkaline Phosphatase 90 38 - 126 U/L   Total Bilirubin 0.4 0.3 - 1.2 mg/dL   GFR calc non Af Amer >60 >60 mL/min   GFR calc Af Amer >60 >60 mL/min   Anion gap 9 5 - 15  Ethanol     Status: None   Collection Time: 12/20/19  2:05 PM  Result Value Ref Range   Alcohol, Ethyl (B) <10 <10 mg/dL  Salicylate level     Status: Abnormal   Collection Time: 12/20/19  2:05 PM  Result Value Ref Range   Salicylate Lvl <7.0 (L) 7.0 - 30.0 mg/dL  Acetaminophen level     Status: Abnormal    Collection Time: 12/20/19  2:05 PM  Result Value Ref Range   Acetaminophen (Tylenol), Serum <10 (L) 10 - 30 ug/mL  cbc     Status: Abnormal   Collection Time: 12/20/19  2:05 PM  Result Value Ref Range   WBC 26.4 (H) 4.0 - 10.5 K/uL   RBC 4.13 (L) 4.22 - 5.81 MIL/uL   Hemoglobin 13.8 13.0 - 17.0 g/dL   HCT 12.4 39 - 52 %   MCV 99.8 80.0 - 100.0 fL   MCH 33.4 26.0 - 34.0 pg   MCHC 33.5 30.0 - 36.0 g/dL   RDW 58.0 99.8 - 33.8 %   Platelets 284 150 - 400 K/uL   nRBC 0.0 0.0 - 0.2 %  Blood gas, venous     Status: Abnormal   Collection Time: 12/20/19  2:21 PM  Result Value Ref Range   pH, Ven 7.27 7.25 - 7.43   pCO2, Ven 61 (H) 44 - 60 mmHg   pO2, Ven 56.0 (H) 32 - 45 mmHg   Bicarbonate 28.0 20.0 - 28.0 mmol/L   Acid-base deficit 0.4 0.0 - 2.0 mmol/L   O2 Saturation 84.1 %   Patient temperature 37.0    Collection site VEIN    Sample type VENIPUNCTURE    ____________________________________________  EKG My review and personal interpretation at Time: 14:13   Indication: sob  Rate: 100  Rhythm: sinus Axis: normal Other: normal intervals, no stemi ____________________________________________  RADIOLOGY  I personally reviewed all radiographic images ordered to evaluate for the above acute complaints and reviewed radiology reports and findings.  These findings were personally discussed with the patient.  Please see medical record for radiology report.  ____________________________________________   PROCEDURES  Procedure(s) performed:  .Critical Care Performed by: Willy Eddy, MD Authorized by: Willy Eddy, MD   Critical care provider statement:    Critical care time (minutes):  35   Critical care time was exclusive of:  Separately billable procedures and treating other patients   Critical care was necessary to treat or prevent imminent or life-threatening deterioration of the following conditions:  Respiratory failure and toxidrome   Critical care was time spent  personally by me on the following activities:  Development of treatment plan with patient or surrogate, discussions with consultants, evaluation of patient's response to treatment, examination of patient, obtaining history from patient or surrogate, ordering and performing treatments and interventions, ordering and review of laboratory studies, ordering and review of radiographic studies, pulse oximetry, re-evaluation of patient's condition and review of old charts      Critical Care performed: yes ____________________________________________   INITIAL IMPRESSION / ASSESSMENT AND PLAN / ED COURSE  Pertinent labs & imaging results that were available during my care of  the patient were reviewed by me and considered in my medical decision making (see chart for details).   DDX: covid 19, overdose, Asthma, copd, CHF, pna, ptx, malignancy, Pe, anemia   Matthew Byrd is a 44 y.o. who presents to the ED with altered mental status as described above.  Ported suspected narcotic overdose.  On review of medical records however appears that he did recently test positive for Covid.  Clinical Course as of Dec 19 1528  Fri Dec 20, 2019  1446 Patient now alert and oriented.  Cannot provide any additional details as to what he might of taken today.  We'll continue to observe.   [PR]  1456 Patient up to nursing desk asking for a cover.  Appears well perfused.  Presentation more consistent with narcotic overdose.  Will observe on monitor sure he is not having any rebound respiratory suppression.   [PR]  1522 Patient now becoming somnolent again requiring supplemental oxygen.  Will redose Narcan and start infusion.  His chest x-ray does not show any significant infiltrates.  I think his hypoxia and white count are likely secondary to his overdose. As he is requiring narcan infusion will discuss with hospitalist for admission.   [PR]    Clinical Course User Index [PR] Willy Eddy, MD    The  patient was evaluated in Emergency Department today for the symptoms described in the history of present illness. He/she was evaluated in the context of the global COVID-19 pandemic, which necessitated consideration that the patient might be at risk for infection with the SARS-CoV-2 virus that causes COVID-19. Institutional protocols and algorithms that pertain to the evaluation of patients at risk for COVID-19 are in a state of rapid change based on information released by regulatory bodies including the CDC and federal and state organizations. These policies and algorithms were followed during the patient's care in the ED.  As part of my medical decision making, I reviewed the following data within the electronic MEDICAL RECORD NUMBER Nursing notes reviewed and incorporated, Labs reviewed, notes from prior ED visits and [place] Controlled Substance Database   ____________________________________________   FINAL CLINICAL IMPRESSION(S) / ED DIAGNOSES  Final diagnoses:  Drug overdose, undetermined intent, initial encounter  Acute respiratory failure with hypoxia (HCC)      NEW MEDICATIONS STARTED DURING THIS VISIT:  New Prescriptions   No medications on file     Note:  This document was prepared using Dragon voice recognition software and may include unintentional dictation errors.    Willy Eddy, MD 12/20/19 1530

## 2019-12-20 NOTE — ED Notes (Signed)
Pt noted to have O2 sat of 85% with good waveform - Placed pt on 4L O2 via nasal cannula and O2 sat improved to 92%  Pt is hard to arouse and only grunts (not verbal at this time)  Dr Roxan Hockey notified and new orders placed

## 2019-12-20 NOTE — ED Notes (Signed)
After receving verbal permission from pt. This RN updated pt's son, Matthew Byrd on pt's status. Pt given phone to speak with son. Son requested update if pt has assigned bed at [phone]

## 2019-12-20 NOTE — ED Triage Notes (Signed)
See First RN Note: pt presents to ED via [facility] after use of Narcan by EMS. Pt arrives to triage and responds to each question, "I don't know". Pt responds to mild stimuli and then goes back to sleep. Pt noted to be hunched over in wheelchair at this time.   When awake pt denies use of drugs at this time. Pt with noted pinpoint pupils on assessment.

## 2019-12-20 NOTE — ED Notes (Signed)
Pt is alert and oriented x4, able to stand on edge of bed and urinate into urinal. Pt states "I want to go home". After educating pt on reasons for admission, pt agreed to stay in the hospital and receive treatment. Pt is currently compliant with keeping the monitor on. NAD noted.

## 2019-12-21 LAB — CBC WITH DIFFERENTIAL/PLATELET
Abs Immature Granulocytes: 0.06 10*3/uL (ref 0.00–0.07)
Basophils Absolute: 0 10*3/uL (ref 0.0–0.1)
Basophils Relative: 0 %
Eosinophils Absolute: 0.1 10*3/uL (ref 0.0–0.5)
Eosinophils Relative: 1 %
HCT: 42.1 % (ref 39.0–52.0)
Hemoglobin: 14.2 g/dL (ref 13.0–17.0)
Immature Granulocytes: 0 %
Lymphocytes Relative: 14 %
Lymphs Abs: 1.8 10*3/uL (ref 0.7–4.0)
MCH: 33.2 pg (ref 26.0–34.0)
MCHC: 33.7 g/dL (ref 30.0–36.0)
MCV: 98.4 fL (ref 80.0–100.0)
Monocytes Absolute: 0.9 10*3/uL (ref 0.1–1.0)
Monocytes Relative: 7 %
Neutro Abs: 10.6 10*3/uL — ABNORMAL HIGH (ref 1.7–7.7)
Neutrophils Relative %: 78 %
Platelets: 278 10*3/uL (ref 150–400)
RBC: 4.28 MIL/uL (ref 4.22–5.81)
RDW: 12.9 % (ref 11.5–15.5)
WBC: 13.5 10*3/uL — ABNORMAL HIGH (ref 4.0–10.5)
nRBC: 0 % (ref 0.0–0.2)

## 2019-12-21 LAB — HIV ANTIBODY (ROUTINE TESTING W REFLEX): HIV Screen 4th Generation wRfx: NONREACTIVE

## 2019-12-21 NOTE — ED Notes (Signed)
Dr. Patel at bedside 

## 2019-12-21 NOTE — Discharge Instructions (Signed)
Accidental Drug Poisoning, Adult Accidental drug poisoning happens when a person accidentally takes too much of a substance, such as a prescription medicine, an over-the-counter medicine, a vitamin, a supplement, or an illegal drug. The effects of drug poisoning can be mild, dangerous, or even deadly. What are the causes? This condition is caused by taking too much of a medicine, illegal drug, or other substance. It often results from:  Lack of knowledge about a substance.  Using more than one substance at the same time.  An error made by the health care provider who prescribed the substance.  An error made by the pharmacist who filled the prescription.  A lapse in memory, such as forgetting that you have already taken a dose of the medicine.  Suddenly using a substance after a long period of not using it. The following substances and medicines are more likely to cause an accidental drug poisoning:  Medicines that treat mental problems (psychotropic medicines).  Pain medicines.  Cocaine.  Heroin.  Multivitamins that contain iron.  Over-the-counter cold and cough medicines. What increases the risk? This condition is more likely to occur in:  Elderly adults. Elderly adults are at risk because they may: ? Be taking many different medicines. ? Have difficulty reading labels. ? Forget when they last took their medicine.  People who use illegal drugs.  People who drink alcohol while using illegal drugs or certain medicines.  People with certain mental health conditions. What are the signs or symptoms? Symptoms of this condition depend on the substance and the amount that was taken. Common symptoms include:  Behavior changes, such as confusion.  Sleepiness.  Weakness.  Slowed breathing.  Nausea and vomiting.  Seizures.  Very large or small eye pupil size. A drug poisoning can cause a very serious condition in which your blood pressure drops to a low level (shock).  Symptoms of shock include:  Cold and clammy skin.  Pale skin.  Blue lips.  Very slow breathing.  Extreme sleepiness.  Severe confusion.  Dizziness or fainting. How is this diagnosed? This condition is diagnosed based on:  Your symptoms. You will be asked about the substances you took and when you took them.  A physical exam. You may also have other tests, including:  Urine tests.  Blood tests.  An electrocardiogram (ECG). How is this treated? This condition may need to be treated right away at the hospital. Treatment may involve:  Getting fluids and electrolytes through an IV.  Having a breathing tube inserted in your airway (endotracheal tube) to help you breathe.  Taking medicines. These may include medicines that: ? Absorb any substance that is in your digestive system. ? Block or reverse the effect of the substance that caused the drug poisoning.  Having your blood filtered through an artificial kidney machine (hemodialysis).  Ongoing counseling and mental health support. This may be provided if you used an illegal drug. Follow these instructions at home: Medicines   Take over-the-counter and prescription medicines only as told by your health care provider.  Before taking a new medicine, ask your health care provider whether the medicine: ? May cause side effects. ? Might react with other medicines.  Keep a list of all the medicines that you take, including over-the-counter medicines, vitamins, supplements, and herbs. Bring this list with you to all of your medical visits. General instructions   Drink enough fluid to keep your urine pale yellow.  If you are working with a counselor or mental health professional, make   sure to follow his or her instructions.  Do not drink alcohol if: ? Your health care provider tells you not to drink. ? You are pregnant, may be pregnant, or are planning to become pregnant.  If you drink alcohol, limit how much you  have: ? 0-1 drink a day for women. ? 0-2 drinks a day for men.  Be aware of how much alcohol is in your drink. In the [place] one drink equals one typical bottle of beer (12 oz), one-half glass of wine (5 oz), or one shot of hard liquor (1 oz).  Keep all follow-up visits as told by your health care provider. This is important. How is this prevented?   Get help if you are struggling with: ? Alcohol or drug use. ? Depression or another mental health problem.  Keep the phone number of your local poison control center near your phone or on your cell phone. The hotline of the [employer] [facility] [employer] [facility] is (800(979)121-1191) (678) 151-5542.  Store all medicines in safety containers that are out of the reach of children.  Read the drug inserts that come with your medicines.  Create a system for taking your medicine, such as a pillbox, that will help you avoid taking too much of the medicine.  Do not drink alcohol while taking medicines unless your health care provider approves.  Do not use illegal drugs.  Do not take medicines that are not prescribed for you. Contact a health care provider if:  Your symptoms return.  You develop new symptoms or side effects after taking a medicine.  You have questions about possible drug poisoning. Call your local poison control center at [phone](800) (678) [phone]. Get help right away if:  You think that you or someone else may have taken too much of a substance.  You or someone else is having symptoms of drug poisoning. Summary  Accidental drug poisoning happens when a person accidentally takes too much of a substance, such as a prescription medicine, an over-the-counter medicine, a vitamin, a supplement, or an illegal drug.  The effects of drug poisoning can be mild, dangerous, or even deadly.  This condition is diagnosed based on your symptoms and a physical exam. You will be asked to tell your health care provider which substances you took and when you  took them.  This condition may need to be treated right away at the hospital. This information is not intended to replace advice given to you by your health care provider. Make sure you discuss any questions you have with your health care provider. Document Revised: 03/24/2017 Document Reviewed: 03/13/2017 Elsevier Patient Education  2020 [employer]  Substance Use Disorder Substance use disorder occurs when a person's repeated use of drugs or alcohol interferes with his or her ability to be productive. This disorder can cause problems with mental and physical health. It can affect your ability to have healthy relationships, and it can keep you from being able to meet your responsibilities at work, home, or school. It can also lead to addiction, which is a condition in which the person cannot stop using the substance consistently for a period of time. Addiction changes the way the brain works. Because of these changes, addiction is a chronic condition. Substance use disorder can be mild, moderate, or severe. The most commonly abused substances include:  Alcohol.  Tobacco.  Marijuana.  Stimulants, such as cocaine and methamphetamine.  Hallucinogens, such as LSD and PCP.  Opioids, such as some prescription pain medicines  and heroin. What are the causes? This condition may develop due to many complex social, psychological, or physical reasons, such as:  Stress.  Abuse.  Peer pressure.  Anxiety or depression. What increases the risk? This condition is more likely to develop in people who:  Use substances to cope with stress.  Have been abused.  Have a mental health disorder, such as depression.  Have a family history of substance use disorder. What are the signs or symptoms? Symptoms of this condition include:  Using the substance for longer periods of time or at a higher dosage than what is normal or intended.  Having a lasting desire to use the substance.  Being  unable to slow down or stop the use of the substance.  Spending an abnormal amount of time getting the substance, using the substance, or recovering from using the substance.  Using the substance in a way that interferes with work, school, social activities, and personal relationships.  Using the substance even after having negative consequences, such as: ? Health problems. ? Legal or financial troubles. ? Job loss. ? Relationship problems.  Needing more and more of the substance to get the same effect (developing tolerance).  Experiencing unpleasant symptoms if you do not use the substance (withdrawal).  Using the substance to avoid withdrawal symptoms. How is this diagnosed? This condition may be diagnosed based on:  A physical exam.  Your history of substance use.  Your symptoms. This includes: ? How substance use affects your life. ? Changes in personality, behaviors, and mood. ? Having at least two symptoms of substance use disorder within a 50-month period. ? Health issues related to substance use, such as liver damage, shortness of breath, fatigue, cough, or heart problems.  Blood or urine tests to screen for alcohol and drugs. How is this treated? This condition may be treated by:  Stopping substance use safely. This may require taking medicines and being closely monitored for several days.  Taking part in group and individual counseling from mental health providers who help people with substance use disorder.  Staying at a live-in (residential) treatment center for several days or weeks.  Attending daily counseling sessions at a treatment center.  Taking medicine as told by your health care provider: ? To ease symptoms and prevent complications during withdrawal. ? To treat other mental health issues, such as depression or anxiety. ? To block cravings by causing the same effects as the substance. ? To block the effects of the substance or replace good sensations  with unpleasant ones.  Participating in a support group to share your experience with others who are going through the same thing. These groups are an important part of long-term recovery for many people. Recovery can be a long process. Many people who undergo treatment start using the substance again after stopping (relapse). If you relapse, that does not mean that treatment will not work. Follow these instructions at home:   Take over-the-counter and prescription medicines only as told by your health care provider.  Do not use any drugs or alcohol.  Avoid temptations or triggers that you associate with your use of the substance.  Learn and practice techniques for managing stress.  Have a plan for vulnerable moments. Get phone numbers of people who are willing to help and who are committed to your recovery.  Attend support groups on a regular basis. These groups include 12-step programs like Alcoholics Anonymous and Narcotics Anonymous.  Keep all follow-up visits as told by your  health care providers. This is important. This includes continuing to work with therapists and support groups. Contact a health care provider if:  You cannot take your medicines as told.  Your symptoms get worse.  You have trouble resisting the urge to use drugs or alcohol. Get help right away if you:  Relapse.  Think that you may have taken too much of a drug. The hotline of the [facility] is 425-668-8861.  Have signs of an overdose. Symptoms include: ? Chest pain. ? Confusion. ? Sleepiness or difficulty staying awake. ? Slowed breathing. ? Nausea or vomiting. ? A seizure.  Have serious thoughts about hurting yourself or someone else. Drug overdose is an emergency. Do not wait to see if the symptoms will go away. Get medical help right away. Call your local emergency services (911 in the [place] Do not drive yourself to the hospital. If you ever feel like you may hurt yourself  or others, or have thoughts about taking your own life, get help right away. You can go to your nearest emergency department or call:  Your local emergency services (911 in the [place]  A suicide crisis helpline, such as the National Suicide Prevention Lifeline at (404)421-6307. This is open 24 hours a day. Summary  Substance use disorder occurs when a person's repeated use of drugs or alcohol interferes with his or her ability to be productive.  Taking part in group and individual counseling from mental health providers is a common treatment for people with substance use disorder.  Recovery can be a long process. Many people who undergo treatment start using the substance again after stopping (relapse). A relapse does not mean that treatment will not work.  Attend support groups such as Alcoholics Anonymous and Narcotics Anonymous. These groups are an important part of long-term recovery for many people. This information is not intended to replace advice given to you by your health care provider. Make sure you discuss any questions you have with your health care provider. Document Revised: 08/02/2018 Document Reviewed: 05/23/2017 Elsevier Patient Education  2020 [employer].  Finding Treatment for Addiction Addiction is a complex disease of the brain that causes an uncontrollable (compulsive) need for:  A substance. This includes alcohol, illegal drugs, or prescription medicines, such as painkillers.  An activity or behavior, such as gambling or shopping. Addiction changes the way your brain works. Because of this change:  The need for the medicine, drug, or activity can become so strong that you think about it all the time.  Getting more and more of your addiction becomes the most important thing to you.  You may find yourself leaving other activities and relationships to pursue your addiction.  You can become physically dependent on a substance.  Your health, behavior,  emotions, and relationships can change for the worse. How do I know if I need treatment for addiction? Addiction is a progressive disease. Without treatment, addiction can get worse. Living with addiction puts you at higher risk for injury, poor health, loss of employment, loss of money, and even death. You might need treatment for addiction if:  You have tried to stop or cut down, but you have not succeeded.  You find it annoying that your friends and family are concerned about your use or behavior.  You feel guilty about your use or behavior.  You need a particular substance or activity to start your day or to calm down.  You are running out of money because of  your addiction.  You have done something illegal to support your addiction.  Your addiction has caused you: ? Health problems. ? Trouble in school, work, home, or with the police. ? To devote all your time to your addiction, and not to other responsibilities. ? To tell lies in order to hide your problem. What types of treatment are available? There may be options for treatment programs and plans based on your addiction, condition, needs, and preferences. No single treatment is right for everyone.  Treatment programs can be: ? Outpatient. You live at home and go to work or school, but you go to a clinic for treatment. ? Inpatient. You live and sleep at the program facility during treatment.  Programs may include: ? Medicine. You may need medicine to treat the addiction itself, or to treat anxiety or depression. ? Counseling and behavior therapy. This can help individuals and families behave in healthier ways and relate more effectively. ? Support groups. Confidential group therapy, such as a 12-step program, can help individuals and families during treatment and recovery. ? A combination of education, counseling, and a 12-step, spirituality-based approach. What should I consider when selecting a treatment program? Think  about your individual requirements when selecting a treatment program. Ask about:  The overall approach to treatment. ? Some programs are strictly 12-step programs. Some have a more flexible approach. ? Programs may differ in length of stay, setting, and size. ? Some programs include your family in your treatment plan. Support may be offered to them throughout the treatment process, as well as instructions for them when you are discharged. ? You may continue to receive support after you have left the program.  The types of medical services that are offered. Find out if the program: ? Offers specific treatment for your particular addiction. ? Meets all of your needs, including physical and cultural needs. ? Includes any medicines you might need. ? Offers mental health counseling as part of your treatment. ? Offers the 12-step meetings at the center, or if transport is available for patients to attend meetings at other locations.  The cost and types of insurance that are accepted. ? Some programs are sponsored by the government. They support patients who do not have private insurance. ? If you do not have insurance, or if you choose to attend a program that does not accept your insurance, call the treatment center. Tell them your financial needs and whether a payment plan can be set up. ? There are also organizations that will help you find the resources for treatment. You can find them online by searching "treatment for addiction."  If the program is certified by the appropriate government agency. Where to find support  Your health care provider can help you to find the right treatment. These discussions are confidential.  The [employer] on Alcoholism and Drug Dependence (NCADD). This group has information about treatment centers and programs for people who have an addiction and for family members. ? Call: 1-800-NCA-CALL (239-819-4669). ? Visit the website:  https://www.ncadd.org/  The Substance Abuse and Mental Health Services Administration [facility]). This organization will help you find publicly funded treatment centers, help hotlines, and counseling services near you. ? Call: 1-800-662-HELP (864-034-2541). ? Visit the website: www.findtreatment.RockT[employer].pl  The National Problem Gambling Helpline. This is a 24-hour confidential helpline for gambling addiction. ? Call: 575 611 5849 ? Visit the website: [employer] In countries outside of the [place] and [place], look in [employer] for contact information for services in your area. Follow  these instructions at home:  Find supportive people who will help you stay away from your addiction and stay sober.  Do not use the substance or engage in the activity.  If you have been through treatment: ? Follow your plan. The plan is usually developed by you and your health care provider during treatment. ? Go to meetings with other people in recovery. ? Avoid people, situations, and things that lead you to do the things you are addicted to (triggers). Summary  Addiction changes the way your brain works. These changes cause a desire to repeat and increase the use of the a substance or behavior.  Addiction is a progressive disease. Without treatment, addiction can get worse. Living with addiction puts you at higher risk for injury, poor health, loss of employment, loss of money, and even death.  There may be options for treatment programs and plans based on your addiction, condition, needs, and preferences. No single treatment is right for everyone.  Your health care provider can help you to find the right treatment. These discussions are confidential. This information is not intended to replace advice given to you by your health care provider. Make sure you discuss any questions you have with your health care provider. Document Revised: 01/02/2019 Document Reviewed:  05/10/2017 Elsevier Patient Education  2020 [employer].

## 2019-12-21 NOTE — ED Notes (Addendum)
Pt visualized resting, VSS. Pt arousable to name, NAD noted.

## 2019-12-21 NOTE — ED Notes (Signed)
Pt sleeping but easily roused by name.  O2 sat 92% RA, placed on 2L [place] and oxygen up to 97%.  Pt denies pain or complaints at this time.  Will continue to closely monitor mentation and vitals.

## 2020-05-14 ENCOUNTER — Emergency Department: Payer: Self-pay

## 2020-05-14 ENCOUNTER — Emergency Department
Admission: EM | Admit: 2020-05-14 | Discharge: 2020-05-15 | Disposition: A | Discharge status: Home / Self Care | Payer: Self-pay | Attending: Emergency Medicine | Admitting: Emergency Medicine

## 2020-05-14 ENCOUNTER — Other Ambulatory Visit: Payer: Self-pay

## 2020-05-14 DIAGNOSIS — F17210 Nicotine dependence, cigarettes, uncomplicated: Secondary | ICD-10-CM | POA: Insufficient documentation

## 2020-05-14 DIAGNOSIS — T402X1A Poisoning by other opioids, accidental (unintentional), initial encounter: Secondary | ICD-10-CM | POA: Insufficient documentation

## 2020-05-14 DIAGNOSIS — T40601A Poisoning by unspecified narcotics, accidental (unintentional), initial encounter: Secondary | ICD-10-CM

## 2020-05-14 DIAGNOSIS — J449 Chronic obstructive pulmonary disease, unspecified: Secondary | ICD-10-CM | POA: Insufficient documentation

## 2020-05-14 LAB — BASIC METABOLIC PANEL
Anion gap: 15 (ref 5–15)
BUN: 12 mg/dL (ref 6–20)
CO2: 24 mmol/L (ref 22–32)
Calcium: 8.8 mg/dL — ABNORMAL LOW (ref 8.9–10.3)
Chloride: 98 mmol/L (ref 98–111)
Creatinine, Ser: 0.85 mg/dL (ref 0.61–1.24)
GFR, Estimated: >60 mL/min (ref >60)
Glucose, Bld: 183 mg/dL — ABNORMAL HIGH (ref 70–99)
Potassium: 4.9 mmol/L (ref 3.5–5.1)
Sodium: 137 mmol/L (ref 135–145)

## 2020-05-14 LAB — CBC WITH DIFFERENTIAL/PLATELET
Abs Immature Granulocytes: 0.05 10*3/uL (ref 0.00–0.07)
Basophils Absolute: 0 10*3/uL (ref 0.0–0.1)
Basophils Relative: 0 %
Eosinophils Absolute: 0.1 10*3/uL (ref 0.0–0.5)
Eosinophils Relative: 1 %
HCT: 40.9 % (ref 39.0–52.0)
Hemoglobin: 13.6 g/dL (ref 13.0–17.0)
Immature Granulocytes: 1 %
Lymphocytes Relative: 52 %
Lymphs Abs: 4.3 10*3/uL — ABNORMAL HIGH (ref 0.7–4.0)
MCH: 32.2 pg (ref 26.0–34.0)
MCHC: 33.3 g/dL (ref 30.0–36.0)
MCV: 96.9 fL (ref 80.0–100.0)
Monocytes Absolute: 0.6 10*3/uL (ref 0.1–1.0)
Monocytes Relative: 7 %
Neutro Abs: 3.2 10*3/uL (ref 1.7–7.7)
Neutrophils Relative %: 39 %
Platelets: 252 10*3/uL (ref 150–400)
RBC: 4.22 MIL/uL (ref 4.22–5.81)
RDW: 12.5 % (ref 11.5–15.5)
WBC: 8.2 10*3/uL (ref 4.0–10.5)
nRBC: 0 % (ref 0.0–0.2)

## 2020-05-14 LAB — ETHANOL: Alcohol, Ethyl (B): 62 mg/dL — ABNORMAL HIGH (ref <10)

## 2020-05-14 MED ORDER — ONDANSETRON HCL 4 MG/2ML IJ SOLN
4.0000 mg | Freq: Once | INTRAMUSCULAR | Status: AC
  Administered 2020-05-15: 4 mg via INTRAVENOUS
  Filled 2020-05-14: qty 2

## 2020-05-14 NOTE — ED Triage Notes (Signed)
Pt to ED via [facility]. Pt was found by PD unresponsive and pulseless. PD is reported to have started CPR in field for approx 2 minutes. PD administered 2 IM narcan. EMS arrived to scene and pt was alert, cyanotic, EMS started to bag pt and administered another 1.5 IM Narcan at 2030. EMS reports pt had similar incident x2 days ago.   Pt arrives to ED alert and oriented x 4, complains of being cold and chest pain. PT states he took percocet's. Arrives with 18G in LAC. Dr. Larinda Buttery at bedside when EMS arrives.

## 2020-05-14 NOTE — ED Notes (Signed)
Pt provided 8 oz cup water and 4 oz can of ginger ale -- pt drinking water but does also request anti-emetic; will notify provider

## 2020-05-14 NOTE — ED Provider Notes (Signed)
[facility] Emergency Department Provider Note   ____________________________________________   Event Date/Time   First MD Initiated Contact with Patient 05/14/20 2119     (approximate)  I have reviewed the triage vital signs and the nursing notes.   HISTORY  Chief Complaint Drug Overdose    HPI Matthew Byrd is a 45 y.o. male with past medical history of COPD and polysubstance abuse who presents to the ED following overdose.  Patient states he remembers taking a Percocet sometime earlier this evening for recreational use.  He then does not remember what happened but remembers waking up in the ambulance.  EMS states that patient was found unresponsive on the ground and PD was first on the scene.  They performed about 2 minutes of CPR but patient woke up after being given 3.5 mg of intranasal Narcan.  Patient now complains of chest pain and feeling cold, denies any difficulty breathing.  He admits to drinking alcohol this evening, denies IV drug use or taking any medication other than Percocet.        Past Medical History:  Diagnosis Date  . COPD (chronic obstructive pulmonary disease) [facility])     Patient Active Problem List   Diagnosis Date Noted  . Opioid overdose (HCC) 12/20/2019    Past Surgical History:  Procedure Laterality Date  . ELBOW SURGERY    . HAND SURGERY    . WRIST SURGERY      Prior to Admission medications   Not on File    Allergies Hydrocil [psyllium] and Hydrocodone  History reviewed. No pertinent family history.  Social History Social History   Tobacco Use  . Smoking status: Current Every Day Smoker    Packs/day: 1.00    Types: Cigarettes  . Smokeless tobacco: Never Used  Vaping Use  . Vaping Use: Never used  Substance Use Topics  . Alcohol use: Yes    Review of Systems  Constitutional: No fever/chills Eyes: No visual changes. ENT: No sore throat. Cardiovascular: Positive for chest  pain. Respiratory: Denies shortness of breath. Gastrointestinal: No abdominal pain.  No nausea, no vomiting.  No diarrhea.  No constipation. Genitourinary: Negative for dysuria. Musculoskeletal: Negative for back pain. Skin: Negative for rash. Neurological: Negative for headaches, focal weakness or numbness.  ____________________________________________   PHYSICAL EXAM:  VITAL SIGNS: ED Triage Vitals  Enc Vitals Group     BP      Pulse      Resp      Temp      Temp src      SpO2      Weight      Height      Head Circumference      Peak Flow      Pain Score      Pain Loc      Pain Edu?      Excl. in [place]?     Constitutional: Alert and oriented. Eyes: Conjunctivae are normal. Head: Atraumatic. Nose: No congestion/rhinnorhea. Mouth/Throat: Mucous membranes are moist. Neck: Normal ROM Cardiovascular: Normal rate, regular rhythm. Grossly normal heart sounds. Respiratory: Normal respiratory effort.  No retractions. Lungs CTAB.  Anterior chest wall tenderness to palpation. Gastrointestinal: Soft and nontender. No distention. Genitourinary: deferred Musculoskeletal: No lower extremity tenderness nor edema. Neurologic:  Normal speech and language. No gross focal neurologic deficits are appreciated. Skin:  Skin is warm, dry and intact. No rash noted. Psychiatric: Mood and affect are normal. Speech and behavior are normal.  ____________________________________________   LABS (all labs ordered are listed, but only abnormal results are displayed)  Labs Reviewed  CBC WITH DIFFERENTIAL/PLATELET - Abnormal; Notable for the following components:      Result Value   Lymphs Abs 4.3 (*)    All other components within normal limits  BASIC METABOLIC PANEL  ETHANOL   ____________________________________________  EKG  ED ECG REPORT I, Chesley Noon, the attending physician, personally viewed and interpreted this ECG.    Date: 05/14/2020  EKG Time: 21:24  Rate: 86  Rhythm:  normal sinus rhythm  Axis: RAD  Intervals:none  ST&T Change: None   PROCEDURES  Procedure(s) performed (including Critical Care):  Procedures   ____________________________________________   INITIAL IMPRESSION / ASSESSMENT AND PLAN / ED COURSE       45 year old male with past medical history of COPD and polysubstance abuse who presents to the ED for reported opiate overdose after receiving brief CPR.  He is awake and alert on arrival with no respiratory depression, complains only of chest soreness.  We will further assess with EKG, labs, and chest x-ray.  We will observe here in the ED to ensure he does not require any additional Narcan.  Patient denies any suicidal ideation and states he took the medication recreationally.  Patient turned over to oncoming provider pending observation and reassessment.      ____________________________________________   FINAL CLINICAL IMPRESSION(S) / ED DIAGNOSES  Final diagnoses:  Opiate overdose, accidental or unintentional, initial encounter [facility])     ED Discharge Orders    None       Note:  This document was prepared using Dragon voice recognition software and may include unintentional dictation errors.   Chesley Noon, MD 05/14/20 2151

## 2020-05-14 NOTE — ED Notes (Signed)
Late entry -- Pt lethargic but answering questions appropriately and calm and cooperative with staff.  RR even and unlabored on RA with symmetrical rise and fal of chest - O2 sats 94%.  Cardiac monitoring initiated and EKG completed after arrival -- NSR noted.  Abdomen soft nontender- no report of n/v.  Skin warm dry and intact.  Pt provided extra warm blankets after arrival when reporting feeling cold- no other needs verbalized.  Will monitor for acute changes and maintain plan of care.

## 2020-05-14 NOTE — ED Provider Notes (Signed)
10:35 PM  Assumed care.  Accidental opiate OD, ETOH +, got 3.5mg  IN narcan in the field, CPR with police, chest soreness here, CXR negative, obs for 2 hours then likely dc, no SI.  12:19 AM  Pt has no acute complaints at this time other than some mild nausea.  Provided with Zofran in the ED.  Tolerating p.o.  No hypoxia, apnea.  No other complaints.  Will discharge.  I reviewed all nursing notes and pertinent previous records as available.  I have reviewed and interpreted any EKGs, lab and urine results, imaging (as available).    Elaria Osias, Layla Maw, DO 05/15/20 [phone]

## 2020-05-15 NOTE — ED Notes (Signed)
Pt aroused with light verbal stimulation - now awake and alert - on the phone calling for a ride home

## 2020-05-15 NOTE — ED Notes (Signed)
Pt GCS 15 - ambulatory in room and to in room bathroom independently and with steady gait then ambulatory to ED waiting room to await his ride -- No acute distress.  Pt has received verbal reinforcement of d/c instructions with written copy - acknowledges verbal understanding and denies any additional questions concerns needs.

## 2022-03-09 ENCOUNTER — Ambulatory Visit: Payer: Self-pay | Admitting: Internal Medicine

## 2022-07-16 ENCOUNTER — Other Ambulatory Visit: Payer: Self-pay

## 2022-07-16 ENCOUNTER — Emergency Department: Payer: Medicaid Other

## 2022-07-16 ENCOUNTER — Emergency Department
Admission: EM | Admit: 2022-07-16 | Discharge: 2022-07-16 | Disposition: A | Discharge status: Home / Self Care | Payer: Medicaid Other | Attending: Emergency Medicine | Admitting: Emergency Medicine

## 2022-07-16 DIAGNOSIS — R197 Diarrhea, unspecified: Secondary | ICD-10-CM | POA: Diagnosis not present

## 2022-07-16 DIAGNOSIS — R1032 Left lower quadrant pain: Secondary | ICD-10-CM | POA: Diagnosis not present

## 2022-07-16 DIAGNOSIS — R112 Nausea with vomiting, unspecified: Secondary | ICD-10-CM | POA: Diagnosis not present

## 2022-07-16 DIAGNOSIS — R109 Unspecified abdominal pain: Secondary | ICD-10-CM

## 2022-07-16 DIAGNOSIS — R1110 Vomiting, unspecified: Secondary | ICD-10-CM | POA: Diagnosis not present

## 2022-07-16 LAB — CBC
HCT: 46.1 % (ref 39.0–52.0)
Hemoglobin: 15.9 g/dL (ref 13.0–17.0)
MCH: 30.6 pg (ref 26.0–34.0)
MCHC: 34.5 g/dL (ref 30.0–36.0)
MCV: 88.8 fL (ref 80.0–100.0)
Platelets: 375 10*3/uL (ref 150–400)
RBC: 5.19 MIL/uL (ref 4.22–5.81)
RDW: 12.7 % (ref 11.5–15.5)
WBC: 13.3 10*3/uL — ABNORMAL HIGH (ref 4.0–10.5)
nRBC: 0 % (ref 0.0–0.2)

## 2022-07-16 LAB — COMPREHENSIVE METABOLIC PANEL
ALT: 11 U/L (ref 0–44)
AST: 19 U/L (ref 15–41)
Albumin: 5 g/dL (ref 3.5–5.0)
Alkaline Phosphatase: 92 U/L (ref 38–126)
Anion gap: 10 (ref 5–15)
BUN: 18 mg/dL (ref 6–20)
CO2: 22 mmol/L (ref 22–32)
Calcium: 9.3 mg/dL (ref 8.9–10.3)
Chloride: 107 mmol/L (ref 98–111)
Creatinine, Ser: 0.85 mg/dL (ref 0.61–1.24)
GFR, Estimated: >60 mL/min (ref >60)
Glucose, Bld: 115 mg/dL — ABNORMAL HIGH (ref 70–99)
Potassium: 2.9 mmol/L — ABNORMAL LOW (ref 3.5–5.1)
Sodium: 139 mmol/L (ref 135–145)
Total Bilirubin: 1.2 mg/dL (ref 0.3–1.2)
Total Protein: 8.9 g/dL — ABNORMAL HIGH (ref 6.5–8.1)

## 2022-07-16 LAB — URINALYSIS, ROUTINE W REFLEX MICROSCOPIC
Bilirubin Urine: NEGATIVE
Glucose, UA: NEGATIVE mg/dL
Ketones, ur: 20 mg/dL — AB
Leukocytes,Ua: NEGATIVE
Nitrite: NEGATIVE
Protein, ur: 30 mg/dL — AB
Specific Gravity, Urine: 1.029 (ref 1.005–1.030)
Squamous Epithelial / HPF: NONE SEEN /HPF (ref 0–5)
pH: 5 (ref 5.0–8.0)

## 2022-07-16 LAB — LIPASE, BLOOD: Lipase: 48 U/L (ref 11–51)

## 2022-07-16 MED ORDER — DICYCLOMINE HCL 10 MG PO CAPS: 10.0000 mg | ORAL_CAPSULE | Freq: Three times a day (TID) | ORAL | 0 refills | Status: AC | PRN

## 2022-07-16 MED ORDER — ONDANSETRON HCL 4 MG/2ML IJ SOLN
4.0000 mg | Freq: Once | INTRAMUSCULAR | Status: AC
  Administered 2022-07-16: 4 mg via INTRAVENOUS
  Filled 2022-07-16: qty 2

## 2022-07-16 MED ORDER — PANTOPRAZOLE SODIUM 40 MG PO TBEC: 40.0000 mg | DELAYED_RELEASE_TABLET | Freq: Every day | ORAL | 1 refills | Status: AC

## 2022-07-16 MED ORDER — SODIUM CHLORIDE 0.9 % IV BOLUS
1000.0000 mL | Freq: Once | INTRAVENOUS | Status: AC
  Administered 2022-07-16: 1000 mL via INTRAVENOUS

## 2022-07-16 MED ORDER — IOHEXOL 300 MG/ML  SOLN
100.0000 mL | Freq: Once | INTRAMUSCULAR | Status: AC | PRN
  Administered 2022-07-16: 100 mL via INTRAVENOUS

## 2022-07-16 MED ORDER — POTASSIUM CHLORIDE CRYS ER 20 MEQ PO TBCR
40.0000 meq | EXTENDED_RELEASE_TABLET | Freq: Once | ORAL | Status: AC
  Administered 2022-07-16: 40 meq via ORAL
  Filled 2022-07-16: qty 2

## 2022-07-16 NOTE — ED Provider Notes (Signed)
[facility] Provider Note    Event Date/Time   First MD Initiated Contact with Patient 07/16/22 1610     (approximate)   History   Abdominal Pain   HPI  Matthew Byrd is a 47 y.o. male  who presents to the emergency department today because of concern for abdominal pain. Located in his left abdomen. Has been present for roughly 1 week. Has been accompanied by nausea, vomiting and diarrhea. No blood in either diarrhea or emesis. No fevers. Does feel like he has lost weight during the past week.     Physical Exam   Triage Vital Signs: ED Triage Vitals  Enc Vitals Group     BP 07/16/22 1600 (!) 109/96     Pulse Rate 07/16/22 1600 (!) 111     Resp 07/16/22 1600 18     Temp 07/16/22 1600 98.2 F (36.8 C)     Temp Source 07/16/22 1600 Oral     SpO2 07/16/22 1600 96 %     Weight 07/16/22 1601 170 lb (77.1 kg)     Height --      Head Circumference --      Peak Flow --      Pain Score 07/16/22 1601 0     Pain Loc --      Pain Edu? --      Excl. in [place]? --     Most recent vital signs: Vitals:   07/16/22 1600  BP: (!) 109/96  Pulse: (!) 111  Resp: 18  Temp: 98.2 F (36.8 C)  SpO2: 96%   General: Awake, alert, oriented. CV:  Good peripheral perfusion. Tachycardia. Resp:  Normal effort. Lungs clear. Abd:  No distention. Tender to palpation in the left abdomen.    ED Results / Procedures / Treatments   Labs (all labs ordered are listed, but only abnormal results are displayed) Labs Reviewed  COMPREHENSIVE METABOLIC PANEL - Abnormal; Notable for the following components:      Result Value   Potassium 2.9 (*)    Glucose, Bld 115 (*)    Total Protein 8.9 (*)    All other components within normal limits  CBC - Abnormal; Notable for the following components:   WBC 13.3 (*)    All other components within normal limits  URINALYSIS, ROUTINE W REFLEX MICROSCOPIC - Abnormal; Notable for the following components:   Color, Urine AMBER (*)     APPearance HAZY (*)    Hgb urine dipstick SMALL (*)    Ketones, ur 20 (*)    Protein, ur 30 (*)    Bacteria, UA RARE (*)    All other components within normal limits  LIPASE, BLOOD     EKG  None   RADIOLOGY I independently interpreted and visualized the CT abd/pel. My interpretation: No free air Radiology interpretation:  IMPRESSION:  1. No specific cause for the patient's symptoms identified.     PROCEDURES:  Critical Care performed: No    MEDICATIONS ORDERED IN ED: Medications - No data to display   IMPRESSION / MDM / [place] / ED COURSE  I reviewed the triage vital signs and the nursing notes.                              Differential diagnosis includes, but is not limited to, diverticulitis, kidney stone, gastritis, gastroenteritis.  Patient's presentation is most consistent with acute presentation with  potential threat to life or bodily function.  Patient presented to the emergency department today because of concerns for nausea vomiting diarrhea and abdominal pain.  On exam patient is tender in the left lower quadrant.  He was afebrile although slightly tachycardic.  Blood work shows a slight leukocytosis.  Did obtain a CT scan given concern for possible intra-abdominal infection.  CT scan without any clear etiology of the patient's symptoms.  He did feel better after treatment here in the emergency department.  This point do wonder if patient suffering possibly from gastritis.  Discussed this with the patient.  Will plan on giving medication to help symptomatic treatment and dietary guidelines.   FINAL CLINICAL IMPRESSION(S) / ED DIAGNOSES   Final diagnoses:  Abdominal pain, unspecified abdominal location    Note:  This document was prepared using Dragon voice recognition software and may include unintentional dictation errors.    Nance Pear, MD 07/16/22 [phone]

## 2022-07-16 NOTE — Discharge Instructions (Signed)
Please seek medical attention for any high fevers, chest pain, shortness of breath, change in behavior, persistent vomiting, bloody stool or any other new or concerning symptoms.  

## 2022-07-16 NOTE — ED Triage Notes (Signed)
Abdominal pain for the past week, has been vomiting as well.

## 2022-07-16 NOTE — ED Notes (Signed)
Patient transported to CT 

## 2022-09-05 ENCOUNTER — Ambulatory Visit: Payer: Self-pay | Admitting: Internal Medicine

## 2022-12-13 ENCOUNTER — Emergency Department: Payer: MEDICAID

## 2022-12-13 ENCOUNTER — Emergency Department
Admission: EM | Admit: 2022-12-13 | Discharge: 2022-12-13 | Disposition: A | Discharge status: Home / Self Care | Payer: MEDICAID | Attending: Student in an Organized Health Care Education/Training Program | Admitting: Student in an Organized Health Care Education/Training Program

## 2022-12-13 ENCOUNTER — Encounter: Payer: Self-pay | Admitting: Emergency Medicine

## 2022-12-13 ENCOUNTER — Other Ambulatory Visit: Payer: Self-pay

## 2022-12-13 DIAGNOSIS — T50904A Poisoning by unspecified drugs, medicaments and biological substances, undetermined, initial encounter: Secondary | ICD-10-CM

## 2022-12-13 DIAGNOSIS — T50901A Poisoning by unspecified drugs, medicaments and biological substances, accidental (unintentional), initial encounter: Secondary | ICD-10-CM | POA: Insufficient documentation

## 2022-12-13 MED ORDER — NALOXONE HCL 4 MG/0.1ML NA LIQD: NASAL | 1 refills | Status: AC

## 2022-12-13 NOTE — ED Triage Notes (Signed)
Pt arrived via [facility] from a residence, pt was found unresponsive by residents, given 12mg  Narcan and CPR started, per EMS on their arrival pt had agonal respirations and was briefly ventilated.  Pt did have an episode of vomiting with EMS and was given 4mg  Zofran IV  10 mins prior to arrival, pt was more coherent and able to talk some, pt did urinate on himself.  Pt is able to answer questions, but is drowsy  Pt reported to EMS that the last thing he remembered was getting off work at [employer].  Pt has hx of fentanyl use but does not know what he took tonight.
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
## 2023-04-01 ENCOUNTER — Emergency Department
Admission: EM | Admit: 2023-04-01 | Discharge: 2023-04-01 | Disposition: A | Discharge status: Home / Self Care | Payer: MEDICAID | Attending: Emergency Medicine | Admitting: Emergency Medicine

## 2023-04-01 ENCOUNTER — Other Ambulatory Visit: Payer: Self-pay

## 2023-04-01 DIAGNOSIS — J449 Chronic obstructive pulmonary disease, unspecified: Secondary | ICD-10-CM | POA: Diagnosis not present

## 2023-04-01 DIAGNOSIS — T402X1A Poisoning by other opioids, accidental (unintentional), initial encounter: Secondary | ICD-10-CM | POA: Diagnosis present

## 2023-04-01 DIAGNOSIS — T40601A Poisoning by unspecified narcotics, accidental (unintentional), initial encounter: Secondary | ICD-10-CM

## 2023-04-01 LAB — CBC WITH DIFFERENTIAL/PLATELET
Abs Immature Granulocytes: 0.05 10*3/uL (ref 0.00–0.07)
Basophils Absolute: 0 10*3/uL (ref 0.0–0.1)
Basophils Relative: 0 %
Eosinophils Absolute: 0 10*3/uL (ref 0.0–0.5)
Eosinophils Relative: 0 %
HCT: 45.2 % (ref 39.0–52.0)
Hemoglobin: 15.2 g/dL (ref 13.0–17.0)
Immature Granulocytes: 0 %
Lymphocytes Relative: 7 %
Lymphs Abs: 0.9 10*3/uL (ref 0.7–4.0)
MCH: 34.3 pg — ABNORMAL HIGH (ref 26.0–34.0)
MCHC: 33.6 g/dL (ref 30.0–36.0)
MCV: 102 fL — ABNORMAL HIGH (ref 80.0–100.0)
Monocytes Absolute: 0.3 10*3/uL (ref 0.1–1.0)
Monocytes Relative: 2 %
Neutro Abs: 11.7 10*3/uL — ABNORMAL HIGH (ref 1.7–7.7)
Neutrophils Relative %: 91 %
Platelets: 285 10*3/uL (ref 150–400)
RBC: 4.43 MIL/uL (ref 4.22–5.81)
RDW: 14.3 % (ref 11.5–15.5)
WBC: 13 10*3/uL — ABNORMAL HIGH (ref 4.0–10.5)
nRBC: 0 % (ref 0.0–0.2)

## 2023-04-01 LAB — BASIC METABOLIC PANEL
Anion gap: 12 (ref 5–15)
BUN: 13 mg/dL (ref 6–20)
CO2: 25 mmol/L (ref 22–32)
Calcium: 8.7 mg/dL — ABNORMAL LOW (ref 8.9–10.3)
Chloride: 98 mmol/L (ref 98–111)
Creatinine, Ser: 0.91 mg/dL (ref 0.61–1.24)
GFR, Estimated: >60 mL/min (ref >60)
Glucose, Bld: 234 mg/dL — ABNORMAL HIGH (ref 70–99)
Potassium: 4.1 mmol/L (ref 3.5–5.1)
Sodium: 135 mmol/L (ref 135–145)

## 2023-04-01 MED ORDER — ONDANSETRON 4 MG PO TBDP
8.0000 mg | ORAL_TABLET | Freq: Once | ORAL | Status: AC
Start: 2023-04-01 — End: 2023-04-01
  Administered 2023-04-01: 8 mg via ORAL
  Filled 2023-04-01: qty 2

## 2023-04-01 MED ORDER — NALOXONE HCL 4 MG/0.1ML NA LIQD
1.0000 | Freq: Once | NASAL | Status: AC
  Administered 2023-04-01: 1 via NASAL
  Filled 2023-04-01: qty 4

## 2023-04-01 MED ORDER — ACETAMINOPHEN 500 MG PO TABS
1000.0000 mg | ORAL_TABLET | Freq: Once | ORAL | Status: AC
  Administered 2023-04-01: 1000 mg via ORAL
  Filled 2023-04-01: qty 2

## 2023-04-01 NOTE — ED Triage Notes (Signed)
Arrives via [facility], called by sister because she found Pt unresponsive on her couch. Fire department showed up first and Pt was 35% RA and started Bag mask valve. Pt was given 2 mg Narcan intranasally, and [facility] gave Pt 1 mg Narcan IV. Pt regained consciousness and came up to 99% RA. Pt doesn't recall how he lost consciousness. [facility] placed #18 on R. Arm.

## 2023-04-01 NOTE — ED Provider Notes (Signed)
[facility] Provider Note    Event Date/Time   First MD Initiated Contact with Patient 04/01/23 1644     (approximate)   History   Chief Complaint: Drug Overdose   HPI  Matthew Byrd is a 47 y.o. male with a history of COPD and recreational Percocet abuse who comes ED due to unresponsiveness patient was found on his couch at home by his sister unresponsive.  EMS report that first responders found an oxygen saturation of 35% on room air, apnea, pinpoint pupils.  They did bag-valve-mask and gave 2 mg intranasal Narcan.  EMS gave additional Narcan and patient became alert with normal mental status and respirations.  Denies any symptoms currently and feels normal.  Denies any recent illness.  Denies IVDU or any recent drug or medication abuse takes no medications routinely.  Review of PDMP shows no recent controlled substance prescriptions.          Physical Exam   Triage Vital Signs: ED Triage Vitals  Encounter Vitals Group     BP 04/01/23 1701 122/84     Systolic BP Percentile --      Diastolic BP Percentile --      Pulse Rate 04/01/23 1701 81     Resp 04/01/23 1701 12     Temp 04/01/23 1701 98.2 F (36.8 C)     Temp Source 04/01/23 1701 Oral     SpO2 04/01/23 1655 100 %     Weight --      Height --      Head Circumference --      Peak Flow --      Pain Score 04/01/23 1701 0     Pain Loc --      Pain Education --      Exclude from Growth Chart --     Most recent vital signs: Vitals:   04/01/23 1802 04/01/23 1903  BP: 92/64 93/63  Pulse: 60 63  Resp: 15 16  Temp:    SpO2: 95% 96%    General: Awake, no distress.  CV:  Good peripheral perfusion.  Regular rate and rhythm Resp:  Normal effort.  Clear to auscultation bilaterally Abd:  No distention.  Soft nontender Other:  No wounds or rash.  No embolic stigmata.  PERRL, 3 mm bilaterally, EOMI, no nystagmus.  GCS 15   ED Results / Procedures / Treatments   Labs (all labs  ordered are listed, but only abnormal results are displayed) Labs Reviewed  BASIC METABOLIC PANEL - Abnormal; Notable for the following components:      Result Value   Glucose, Bld 234 (*)    Calcium 8.7 (*)    All other components within normal limits  CBC WITH DIFFERENTIAL/PLATELET - Abnormal; Notable for the following components:   WBC 13.0 (*)    MCV 102.0 (*)    MCH 34.3 (*)    Neutro Abs 11.7 (*)    All other components within normal limits     EKG    RADIOLOGY    PROCEDURES:  Procedures   MEDICATIONS ORDERED IN ED: Medications  ondansetron (ZOFRAN-ODT) disintegrating tablet 8 mg (has no administration in time range)  acetaminophen (TYLENOL) tablet 1,000 mg (has no administration in time range)  naloxone (NARCAN) nasal spray 4 mg/0.1 mL (has no administration in time range)     IMPRESSION / MDM / ASSESSMENT AND PLAN / ED COURSE  I reviewed the triage vital signs and the nursing notes.  DDx: Anemia, electrolyte abnormality, AKI, opiate overdose  Patient's presentation is most consistent with acute presentation with potential threat to life or bodily function.  Patient brought to the ED due to being found unresponsive with a clinical syndrome highly suspicious for opiate overdose.  Narcan provided resolution of symptoms.  He is breathing comfortably, reassuring exam.  Patient is evasive with questioning and not able to explain the cause of today's events.  Doubt ACS PE dissection stroke intracranial hemorrhage seizure.  Will check labs, observe in ED to ensure no relapsing symptoms in the near term.Marland Kitchen   ----------------------------------------- 7:15 PM on 04/01/2023 ----------------------------------------- Patient remains calm, comfortable, asymptomatic except mild headache.  Unlabored breathing.  GCS 15.  Stable for discharge.  Will request Narcan autoinjector to be dispensed to patient      FINAL CLINICAL IMPRESSION(S) / ED DIAGNOSES   Final diagnoses:   Opiate overdose, accidental or unintentional, initial encounter [facility])     Rx / DC Orders   ED Discharge Orders     None        Note:  This document was prepared using Dragon voice recognition software and may include unintentional dictation errors.   Sharman Cheek, MD 04/01/23 [phone]

## 2023-04-01 NOTE — ED Notes (Signed)
Basics and red top sent off to the lab.

## 2023-04-15 ENCOUNTER — Emergency Department
Admission: EM | Admit: 2023-04-15 | Discharge: 2023-04-15 | Disposition: A | Discharge status: Home / Self Care | Payer: MEDICAID | Attending: Emergency Medicine | Admitting: Emergency Medicine

## 2023-04-15 ENCOUNTER — Other Ambulatory Visit: Payer: Self-pay

## 2023-04-15 DIAGNOSIS — F1910 Other psychoactive substance abuse, uncomplicated: Secondary | ICD-10-CM | POA: Diagnosis present

## 2023-04-15 DIAGNOSIS — F419 Anxiety disorder, unspecified: Secondary | ICD-10-CM | POA: Insufficient documentation

## 2023-04-15 DIAGNOSIS — Y92410 Unspecified street and highway as the place of occurrence of the external cause: Secondary | ICD-10-CM | POA: Diagnosis not present
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
